# Patient Record
Sex: Female | Born: 1975 | Race: Black or African American | Hispanic: No | Marital: Single | Smoking: Current every day smoker
Health system: Southern US, Community
[De-identification: ages and names within clinical notes are randomized; demographics above are authoritative.]

## PROBLEM LIST (undated history)

## (undated) DIAGNOSIS — F32A Depression, unspecified: Secondary | ICD-10-CM

## (undated) DIAGNOSIS — R569 Unspecified convulsions: Secondary | ICD-10-CM

## (undated) DIAGNOSIS — R519 Headache, unspecified: Secondary | ICD-10-CM

## (undated) DIAGNOSIS — J45909 Unspecified asthma, uncomplicated: Secondary | ICD-10-CM

## (undated) DIAGNOSIS — G459 Transient cerebral ischemic attack, unspecified: Secondary | ICD-10-CM

## (undated) HISTORY — PX: DILATION AND CURETTAGE OF UTERUS: SHX78

## (undated) HISTORY — PX: TUBAL LIGATION: SHX77

## (undated) HISTORY — PX: TONSILLECTOMY: SUR1361

## (undated) HISTORY — PX: BREAST SURGERY: SHX581

---

## 2004-03-07 ENCOUNTER — Emergency Department: Payer: Self-pay | Admitting: Emergency Medicine

## 2005-01-04 ENCOUNTER — Emergency Department: Payer: Self-pay | Admitting: Emergency Medicine

## 2005-03-21 ENCOUNTER — Emergency Department: Payer: Self-pay | Admitting: Emergency Medicine

## 2005-10-02 ENCOUNTER — Emergency Department: Payer: Self-pay | Admitting: Emergency Medicine

## 2006-03-11 ENCOUNTER — Emergency Department: Payer: Self-pay | Admitting: Emergency Medicine

## 2006-09-16 ENCOUNTER — Emergency Department: Payer: Self-pay | Admitting: Emergency Medicine

## 2006-11-12 ENCOUNTER — Encounter: Admission: RE | Admit: 2006-11-12 | Discharge: 2006-11-12 | Payer: Self-pay | Admitting: Family Medicine

## 2009-10-29 ENCOUNTER — Emergency Department: Payer: Self-pay | Admitting: Emergency Medicine

## 2011-12-10 ENCOUNTER — Emergency Department: Payer: Self-pay | Admitting: Emergency Medicine

## 2011-12-10 LAB — URINALYSIS, COMPLETE
Blood: NEGATIVE
Glucose,UR: NEGATIVE mg/dL (ref 0–75)
Leukocyte Esterase: NEGATIVE
Nitrite: NEGATIVE
Ph: 6 (ref 4.5–8.0)
RBC,UR: 5 /HPF (ref 0–5)

## 2011-12-10 LAB — WET PREP, GENITAL

## 2012-07-25 ENCOUNTER — Emergency Department: Payer: Self-pay | Admitting: Emergency Medicine

## 2012-07-25 LAB — COMPREHENSIVE METABOLIC PANEL
Albumin: 4.1 g/dL (ref 3.4–5.0)
Calcium, Total: 9 mg/dL (ref 8.5–10.1)
Chloride: 103 mmol/L (ref 98–107)
Co2: 25 mmol/L (ref 21–32)
Creatinine: 1.01 mg/dL (ref 0.60–1.30)
EGFR (African American): >60
EGFR (Non-African Amer.): >60
Osmolality: 273 (ref 275–301)
Potassium: 4 mmol/L (ref 3.5–5.1)
SGPT (ALT): 20 U/L (ref 12–78)
Sodium: 136 mmol/L (ref 136–145)

## 2012-07-25 LAB — URINALYSIS, COMPLETE
Bilirubin,UR: NEGATIVE
Blood: NEGATIVE
Ketone: NEGATIVE
Leukocyte Esterase: NEGATIVE
Ph: 6 (ref 4.5–8.0)
Squamous Epithelial: <1

## 2012-07-25 LAB — CBC
HGB: 14.9 g/dL (ref 12.0–16.0)
MCH: 30.4 pg (ref 26.0–34.0)
MCHC: 33.9 g/dL (ref 32.0–36.0)
MCV: 90 fL (ref 80–100)
RBC: 4.9 10*6/uL (ref 3.80–5.20)
RDW: 13.7 % (ref 11.5–14.5)
WBC: 9.8 10*3/uL (ref 3.6–11.0)

## 2014-07-05 ENCOUNTER — Emergency Department: Payer: Self-pay | Admitting: Emergency Medicine

## 2015-03-07 ENCOUNTER — Emergency Department
Admission: EM | Admit: 2015-03-07 | Discharge: 2015-03-07 | Disposition: A | Discharge status: Home / Self Care | Payer: Medicare Other | Attending: Emergency Medicine | Admitting: Emergency Medicine

## 2015-03-07 ENCOUNTER — Emergency Department: Payer: Medicare Other

## 2015-03-07 DIAGNOSIS — X58XXXA Exposure to other specified factors, initial encounter: Secondary | ICD-10-CM | POA: Insufficient documentation

## 2015-03-07 DIAGNOSIS — Z3202 Encounter for pregnancy test, result negative: Secondary | ICD-10-CM | POA: Insufficient documentation

## 2015-03-07 DIAGNOSIS — Y9289 Other specified places as the place of occurrence of the external cause: Secondary | ICD-10-CM | POA: Insufficient documentation

## 2015-03-07 DIAGNOSIS — Y998 Other external cause status: Secondary | ICD-10-CM | POA: Diagnosis not present

## 2015-03-07 DIAGNOSIS — S63255A Unspecified dislocation of left ring finger, initial encounter: Secondary | ICD-10-CM | POA: Diagnosis not present

## 2015-03-07 DIAGNOSIS — F1310 Sedative, hypnotic or anxiolytic abuse, uncomplicated: Secondary | ICD-10-CM | POA: Diagnosis not present

## 2015-03-07 DIAGNOSIS — F1410 Cocaine abuse, uncomplicated: Secondary | ICD-10-CM | POA: Insufficient documentation

## 2015-03-07 DIAGNOSIS — Z720 Tobacco use: Secondary | ICD-10-CM | POA: Diagnosis not present

## 2015-03-07 DIAGNOSIS — G40909 Epilepsy, unspecified, not intractable, without status epilepticus: Secondary | ICD-10-CM | POA: Insufficient documentation

## 2015-03-07 DIAGNOSIS — Y9389 Activity, other specified: Secondary | ICD-10-CM | POA: Diagnosis not present

## 2015-03-07 DIAGNOSIS — R569 Unspecified convulsions: Secondary | ICD-10-CM | POA: Diagnosis present

## 2015-03-07 DIAGNOSIS — S63259A Unspecified dislocation of unspecified finger, initial encounter: Secondary | ICD-10-CM

## 2015-03-07 LAB — URINALYSIS COMPLETE WITH MICROSCOPIC (ARMC ONLY)
BILIRUBIN URINE: NEGATIVE
GLUCOSE, UA: NEGATIVE mg/dL
Leukocytes, UA: NEGATIVE
Nitrite: NEGATIVE
PH: 5 (ref 5.0–8.0)
Protein, ur: NEGATIVE mg/dL
RBC / HPF: NONE SEEN RBC/hpf (ref 0–5)
Specific Gravity, Urine: 1.014 (ref 1.005–1.030)

## 2015-03-07 LAB — COMPREHENSIVE METABOLIC PANEL
ALBUMIN: 5 g/dL (ref 3.5–5.0)
ALK PHOS: 51 U/L (ref 38–126)
ALT: 18 U/L (ref 14–54)
AST: 22 U/L (ref 15–41)
Anion gap: 12 (ref 5–15)
BILIRUBIN TOTAL: 0.8 mg/dL (ref 0.3–1.2)
BUN: 12 mg/dL (ref 6–20)
CALCIUM: 9.9 mg/dL (ref 8.9–10.3)
CO2: 21 mmol/L — AB (ref 22–32)
CREATININE: 0.83 mg/dL (ref 0.44–1.00)
Chloride: 107 mmol/L (ref 101–111)
GFR calc Af Amer: >60 mL/min (ref >60)
GFR calc non Af Amer: >60 mL/min (ref >60)
GLUCOSE: 86 mg/dL (ref 65–99)
Potassium: 4.3 mmol/L (ref 3.5–5.1)
SODIUM: 140 mmol/L (ref 135–145)
TOTAL PROTEIN: 8.6 g/dL — AB (ref 6.5–8.1)

## 2015-03-07 LAB — CBC
HEMATOCRIT: 44.5 % (ref 35.0–47.0)
HEMOGLOBIN: 14.7 g/dL (ref 12.0–16.0)
MCH: 29.4 pg (ref 26.0–34.0)
MCHC: 33 g/dL (ref 32.0–36.0)
MCV: 89.1 fL (ref 80.0–100.0)
Platelets: 204 10*3/uL (ref 150–440)
RBC: 4.99 MIL/uL (ref 3.80–5.20)
RDW: 13.8 % (ref 11.5–14.5)
WBC: 12.6 10*3/uL — AB (ref 3.6–11.0)

## 2015-03-07 LAB — URINE DRUG SCREEN, QUALITATIVE (ARMC ONLY)
AMPHETAMINES, UR SCREEN: NOT DETECTED
BARBITURATES, UR SCREEN: NOT DETECTED
BENZODIAZEPINE, UR SCRN: NOT DETECTED
Cannabinoid 50 Ng, Ur ~~LOC~~: NOT DETECTED
Cocaine Metabolite,Ur ~~LOC~~: POSITIVE — AB
MDMA (Ecstasy)Ur Screen: NOT DETECTED
METHADONE SCREEN, URINE: NOT DETECTED
OPIATE, UR SCREEN: NOT DETECTED
Phencyclidine (PCP) Ur S: NOT DETECTED
TRICYCLIC, UR SCREEN: POSITIVE — AB

## 2015-03-07 LAB — POCT PREGNANCY, URINE: Preg Test, Ur: NEGATIVE

## 2015-03-07 MED ORDER — BUPIVACAINE HCL (PF) 0.5 % IJ SOLN
INTRAMUSCULAR | Status: AC
  Filled 2015-03-07: qty 30

## 2015-03-07 MED ORDER — PHENYTOIN 50 MG PO CHEW
400.0000 mg | CHEWABLE_TABLET | ORAL | Status: AC
  Administered 2015-03-07: 400 mg via ORAL
  Filled 2015-03-07: qty 8

## 2015-03-07 MED ORDER — PHENYTOIN SODIUM EXTENDED 100 MG PO CAPS: 300.0000 mg | ORAL_CAPSULE | Freq: Every day | ORAL | Status: AC

## 2015-03-07 MED ORDER — LEVETIRACETAM 500 MG PO TABS: 1000.0000 mg | ORAL_TABLET | Freq: Once | ORAL | Status: DC

## 2015-03-07 NOTE — ED Notes (Signed)
Pharm is tubing down Dilantin, not found in pyxis

## 2015-03-07 NOTE — ED Notes (Signed)
RN made pt aware of needed urine specimen, pt reports she urinated before entering the room. Will wait for specimen

## 2015-03-07 NOTE — Discharge Instructions (Signed)
Finger Dislocation Finger dislocation is the displacement of bones in your finger at the joints. Most commonly, finger dislocation occurs at the proximal interphalangeal joint (the joint closest to your knuckle). Very strong, fibrous tissues (ligaments) and joint capsules connect the three bones of your fingers.  CAUSES Dislocation is caused by a forceful impact. This impact moves these bones off the joint and often tears your ligaments.  SYMPTOMS Symptoms of finger dislocation include:  Deformity of your finger.  Pain, with loss of movement. DIAGNOSIS  Finger dislocation is diagnosed with a physical exam. Often, X-ray exams are done to see if you have associated injuries, such as bone fractures. TREATMENT  Finger dislocations are treated by putting your bones back into position (reduction) either by manually moving the bones back into place or through surgery. Your finger is then kept in a fixed position (immobilized) with the use of a dressing or splint for a brief period. When your ligament has to be surgically repaired, it needs to be kept in a fixed position with a dressing or splint for 1 to 2 weeks. Because joint stiffness is a long-term complication of finger dislocation, hand exercises or physical therapy to increase the range of motion and to regain strength is usually started as soon as the ligament is healed. Exercises and therapy generally last no more than 3 months. HOME CARE INSTRUCTIONS The following measures can help to reduce pain and speed up the healing process:  Rest your injured joint. Do not move until instructed otherwise by your caregiver. Avoid activities similar to the one that caused your injury.  Apply ice to your injured joint for the first day or 2 after your reduction or as directed by your caregiver. Applying ice helps to reduce inflammation and pain.  Put ice in a plastic bag.  Place a towel between your skin and the bag.  Leave the ice on for 15-20 minutes  at a time, every 2 hours while you are awake.  Elevate your hand above your heart as directed by your caregiver to reduce swelling.  Take over-the-counter or prescription medicine for pain as your caregiver instructs you. SEEK IMMEDIATE MEDICAL CARE IF:  Your dressing or splint becomes damaged.  Your pain becomes worse rather than better.  You lose feeling in your finger, or it becomes cold and white. MAKE SURE YOU:  Understand these instructions.  Will watch your condition.  Will get help right away if you are not doing well or get worse.   This information is not intended to replace advice given to you by your health care provider. Make sure you discuss any questions you have with your health care provider.   Document Released: 04/11/2000 Document Revised: 05/05/2014 Document Reviewed: 09/08/2014 Elsevier Interactive Patient Education [employer]  Epilepsy Epilepsy is a disorder in which a person has repeated seizures over time. A seizure is a release of abnormal electrical activity in the brain. Seizures can cause a change in attention, behavior, or the ability to remain awake and alert (altered mental status). Seizures often involve uncontrollable shaking (convulsions).  Most people with epilepsy lead normal lives. However, people with epilepsy are at an increased risk of falls, accidents, and injuries. Therefore, it is important to begin treatment right away. CAUSES  Epilepsy has many possible causes. Anything that disturbs the normal pattern of brain cell activity can lead to seizures. This may include:   Head injury.  Birth trauma.  High fever as a child.  Stroke.  Bleeding  into or around the brain.  Certain drugs.  Prolonged low oxygen, such as what occurs after CPR efforts.  Abnormal brain development.  Certain illnesses, such as meningitis, encephalitis (brain infection), malaria, and other infections.  An imbalance of nerve signaling chemicals  (neurotransmitters).  SIGNS AND SYMPTOMS  The symptoms of a seizure can vary greatly from one person to another. Right before a seizure, you may have a warning (aura) that a seizure is about to occur. An aura may include the following symptoms:  Fear or anxiety.  Nausea.  Feeling like the room is spinning (vertigo).  Vision changes, such as seeing flashing lights or spots. Common symptoms during a seizure include:  Abnormal sensations, such as an abnormal smell or a bitter taste in the mouth.   Sudden, general body stiffness.   Convulsions that involve rhythmic jerking of the face, arm, or leg on one or both sides.   Sudden change in consciousness.   Appearing to be awake but not responding.   Appearing to be asleep but cannot be awakened.   Grimacing, chewing, lip smacking, drooling, tongue biting, or loss of bowel or bladder control. After a seizure, you may feel sleepy for a while. DIAGNOSIS  Your health care provider will ask about your symptoms and take a medical history. Descriptions from any witnesses to your seizures will be very helpful in the diagnosis. A physical exam, including a detailed neurological exam, is necessary. Various tests may be done, such as:   An electroencephalogram (EEG). This is a painless test of your brain waves. In this test, a diagram is created of your brain waves. These diagrams can be interpreted by a specialist.  An MRI of the brain.   A CT scan of the brain.   A spinal tap (lumbar puncture, LP).  Blood tests to check for signs of infection or abnormal blood chemistry. TREATMENT  There is no cure for epilepsy, but it is generally treatable. Once epilepsy is diagnosed, it is important to begin treatment as soon as possible. For most people with epilepsy, seizures can be controlled with medicines. The following may also be used:  A pacemaker for the brain (vagus nerve stimulator) can be used for people with seizures that are not  well controlled by medicine.  Surgery on the brain. For some people, epilepsy eventually goes away. HOME CARE INSTRUCTIONS   Follow your health care provider's recommendations on driving and safety in normal activities.  Get enough rest. Lack of sleep can cause seizures.  Only take over-the-counter or prescription medicines as directed by your health care provider. Take any prescribed medicine exactly as directed.  Avoid any known triggers of your seizures.  Keep a seizure diary. Record what you recall about any seizure, especially any possible trigger.   Make sure the people you live and work with know that you are prone to seizures. They should receive instructions on how to help you. In general, a witness to a seizure should:   Cushion your head and body.   Turn you on your side.   Avoid unnecessarily restraining you.   Not place anything inside your mouth.   Call for emergency medical help if there is any question about what has occurred.   Follow up with your health care provider as directed. You may need regular blood tests to monitor the levels of your medicine.  SEEK MEDICAL CARE IF:   You develop signs of infection or other illness. This might increase the risk of a  seizure.   You seem to be having more frequent seizures.   Your seizure pattern is changing.  SEEK IMMEDIATE MEDICAL CARE IF:   You have a seizure that does not stop after a few moments.   You have a seizure that causes any difficulty in breathing.   You have a seizure that results in a very severe headache.   You have a seizure that leaves you with the inability to speak or use a part of your body.    This information is not intended to replace advice given to you by your health care provider. Make sure you discuss any questions you have with your health care provider.   Document Released: 04/14/2005 Document Revised: 02/02/2013 Document Reviewed: 11/24/2012 Elsevier Interactive  Patient Education [employer]

## 2015-03-07 NOTE — ED Notes (Addendum)
MD Quale at bedside, doing dig block at this time. Bupivacaine pulled and given to MD Quale at this time for administration during dig block.

## 2015-03-07 NOTE — ED Notes (Signed)
MD at bedside. 

## 2015-03-07 NOTE — ED Notes (Signed)
Pt presents with deformity and limited movement to left ring finger. States she had a seizure at home and woke up with finger pain. Complains of pain finger only at this time. Will continue to monitor.

## 2015-03-07 NOTE — ED Provider Notes (Signed)
[facility] Emergency Department Provider Note REMINDER - THIS NOTE IS NOT A FINAL MEDICAL RECORD UNTIL IT IS SIGNED. UNTIL THEN, THE CONTENT BELOW MAY REFLECT INFORMATION FROM A DOCUMENTATION TEMPLATE, NOT THE ACTUAL PATIENT VISIT. ____________________________________________  Time seen: Approximately 3:41 PM  I have reviewed the triage vital signs and the nursing notes.   HISTORY  Chief Complaint Seizures and Hand Pain    HPI Alisha Taylor is a 39 y.o. female reports that she had a seizure today. This was witnessed where she was watching television, she suddenly became unresponsive and had a generalized shaking episodes last about 5 minutes. Patient does not recall the event but it was slight headache afterwards and now feels that is gone. She is denying any concerns, headache, neck pain, fall or injury except for a possible "broken" left ring finger. She is retaken her wedding ring and moved it to the other hand, not wearing any other rings.  She denies any facial droop, difficulty speaking. She does tell me that she has seizures which she's been having and were witnessed by friends over the last several years occurring a proximally once every 4 months, however she has never been evaluated by Dr. for them.  Describes and achy pain over the left second finger. She does describe a slight feeling of numbness at the tip of the left finger. Moderate intensity.   History reviewed. No pertinent past medical history.  There are no active problems to display for this patient.   History reviewed. No pertinent past surgical history.  Current Outpatient Rx  Name  Route  Sig  Dispense  Refill  . diclofenac (VOLTAREN) 50 MG EC tablet   Oral   Take 50 mg by mouth 4 (four) times daily as needed for mild pain or moderate pain.         Marland Kitchen gabapentin (NEURONTIN) 300 MG capsule   Oral   Take 900 mg by mouth 3 (three) times daily as needed (chronic back pain).          Marland Kitchen metoCLOPramide (REGLAN) 10 MG tablet   Oral   Take 10 mg by mouth every 6 (six) hours as needed for nausea or vomiting.         . naproxen (NAPROSYN) 500 MG tablet   Oral   Take 500 mg by mouth 2 (two) times daily as needed for mild pain or moderate pain.         . phenytoin (DILANTIN) 100 MG ER capsule   Oral   Take 3 capsules (300 mg total) by mouth at bedtime.   60 capsule   0     Allergies Review of patient's allergies indicates no known allergies.  History reviewed. No pertinent family history.  Social History Social History  Substance Use Topics  . Smoking status: Current Every Day Smoker  . Smokeless tobacco: None  . Alcohol Use: Yes    Review of Systems Constitutional: No fever/chills Eyes: No visual changes. ENT: No sore throat. Cardiovascular: Denies chest pain. Respiratory: Denies shortness of breath. Gastrointestinal: No abdominal pain.  No nausea, no vomiting.  No diarrhea.  No constipation. Genitourinary: Negative for dysuria. Musculoskeletal: Negative for back pain. Skin: Negative for rash. Neurological: Negative for headaches, focal weakness or numbness.  10-point ROS otherwise negative.  ____________________________________________   PHYSICAL EXAM:  VITAL SIGNS: ED Triage Vitals  Enc Vitals Group     BP 03/07/15 1402 137/96 mmHg     Pulse Rate 03/07/15 1402 117  Resp 03/07/15 1402 16     Temp 03/07/15 1402 98.4 F (36.9 C)     Temp Source 03/07/15 1402 Oral     SpO2 03/07/15 1402 100 %     Weight 03/07/15 1402 185 lb (83.915 kg)     Height 03/07/15 1402 5\' 7"  (1.702 m)     Head Cir --      Peak Flow --      Pain Score 03/07/15 1404 4     Pain Loc --      Pain Edu? --      Excl. in GC? --    Constitutional: Alert and oriented. Well appearing and in no acute distress. Eyes: Conjunctivae are normal. PERRL. EOMI. Head: Atraumatic. Nose: No congestion/rhinnorhea. Mouth/Throat: Mucous membranes are moist.  Oropharynx  non-erythematous. Neck: No stridor.  No cervical spine tenderness. Cardiovascular: Normal rate, regular rhythm. Grossly normal heart sounds.  Good peripheral circulation. Respiratory: Normal respiratory effort.  No retractions. Lungs CTAB. Gastrointestinal: Soft and nontender. No distention. No abdominal bruits. No CVA tenderness. Musculoskeletal:  Right upper extremity, hand, elbow and shoulder atraumatic Left upper extremity elbow, shoulder, upper arm and forearm atraumatic. The left hand is atraumatic with full range of motion of all digits and normal median ulnar and radial nerve exam with exception to the fourth digit where there is obvious deformity at the PIP joint without evidence of open fracture bleeding. Patient is unable to move the left fourth digit, appears to be either fractured or dislocated based on bedside exam. She does have normal capillary refill in the left finger which is less than 2 seconds, she does note some slight decrease in light touch over the distal tuft of the left finger.  No lower extremity tenderness nor edema.  No joint effusions. Neurologic:  Normal speech and language. Normal cranial nerve exam. No facial droop. Clear and normal speech. 5 out of 5 strength all extremities. No gross focal neurologic deficits are appreciated. No gait instability. Skin:  Skin is warm, dry and intact. No rash noted. Psychiatric: Mood and affect are normal. Speech and behavior are normal.  ____________________________________________   LABS (all labs ordered are listed, but only abnormal results are displayed)  Labs Reviewed  URINALYSIS COMPLETEWITH MICROSCOPIC ([facility] ONLY) - Abnormal; Notable for the following:    Color, Urine YELLOW (*)    APPearance CLEAR (*)    Ketones, ur 1+ (*)    Hgb urine dipstick 1+ (*)    Bacteria, UA RARE (*)    Squamous Epithelial / LPF 0-5 (*)    All other components within normal limits  URINE DRUG SCREEN, QUALITATIVE ([facility] ONLY) - Abnormal;  Notable for the following:    Tricyclic, Ur Screen POSITIVE (*)    Cocaine Metabolite,Ur Teresita POSITIVE (*)    All other components within normal limits  CBC - Abnormal; Notable for the following:    WBC 12.6 (*)    All other components within normal limits  COMPREHENSIVE METABOLIC PANEL - Abnormal; Notable for the following:    CO2 21 (*)    Total Protein 8.6 (*)    All other components within normal limits  POC URINE PREG, ED  POCT PREGNANCY, URINE   ____________________________________________  EKG  ED ECG REPORT I, Tiarah Shisler, the attending physician, personally viewed and interpreted this ECG.  Date: 03/07/2015 EKG Time: 1646 Rate: 90 Rhythm: normal sinus rhythm QRS Axis: normal Intervals: normal ST/T Wave abnormalities: normal Conduction Disutrbances: none Narrative Interpretation: unremarkable  ____________________________________________  RADIOLOGY   Final result by Rad Results In Interface (03/07/15 17:02:57)   Narrative:   CLINICAL DATA: Recent dislocation fourth PIP joint ; post rid  EXAM: LEFT FOURTH FINGER 2+V  COMPARISON: Study obtained earlier in the day  FINDINGS: Frontal, oblique, and lateral views obtained. There has been successful reduction of recent dislocation of the fourth PIP joint. There is soft tissue swelling in the fourth PIP joint region. On the lateral view, there is a subtle area of calcification volar to the distal aspect of the fourth proximal phalanx, likely a small avulsion. No other evidence of fracture.  IMPRESSION: Successful reduction of fourth PIP joint dislocation. Probable small avulsion along the volar distal aspect of the fourth proximal phalanx. Soft tissue swelling fourth PIP joint, primarily dorsally.   Electronically Signed By: Bretta Bang III M.D. On: 03/07/2015 17:02          CT Head Wo Contrast (Final result) Result time: 03/07/15 16:32:13   Final result by Rad Results In Interface  (03/07/15 16:32:13)   Narrative:   CLINICAL DATA: Recent seizure  EXAM: CT HEAD WITHOUT CONTRAST  TECHNIQUE: Contiguous axial images were obtained from the base of the skull through the vertex without intravenous contrast.  COMPARISON: October 29, 2009  FINDINGS: The ventricles are normal in size and configuration. There is no intracranial mass hemorrhage, extra-axial fluid collection, or midline shift. The gray-white compartments are normal. No acute infarct evident. The bony calvarium appears intact. The mastoid air cells are clear.  IMPRESSION: Study within normal limits.   Electronically Signed By: Bretta Bang III M.D. On: 03/07/2015 16:32          DG Hand Complete Left (Final result) Result time: 03/07/15 16:11:07   Final result by Rad Results In Interface (03/07/15 16:11:07)   Narrative:   CLINICAL DATA: Seizure. Finger pain  EXAM: LEFT HAND - COMPLETE 3+ VIEW  COMPARISON: None.  FINDINGS: Dorsal dislocation of the fourth PIP joint. No fracture identified. No significant arthropathy.  Fracture at the base of the ulnar styloid which is well corticated and likely chronic. Correlate with any pain in this area.  IMPRESSION: Dislocation of the fourth PIP joint without fracture.  Fracture base of the ulnar styloid which appears chronic.     ____________________________________________   PROCEDURES  Procedure(s) performed: None  Critical Care performed: No  NERVE BLOCK Performed by: Sharyn Creamer Consent: Verbal consent obtained. Required items: required blood products, implants, devices, and special equipment available Time out: Immediately prior to procedure a "time out" was called to verify the correct patient, procedure, equipment, support staff and site/side marked as required.  Indication: Pain and deformity of the left ring finger  Nerve block body site: Left ring finger digital block   Preparation: Patient was prepped  and draped in the usual sterile fashion. Needle gauge: 24 G Location technique: anatomical landmarks  Local anesthetic: 0.5% bupivacaine Anesthetic total: 2 ml  Outcome: pain improved Patient tolerance: Patient tolerated the procedure well with no immediate complications.  Reduction of dislocation Date/Time: 7:53 PM Performed by: Sharyn Creamer Authorized by: Sharyn Creamer Consent: Verbal consent obtained. Risks and benefits: risks, benefits and alternatives were discussed Consent given by: patient Required items: Time out: Immediately prior to procedure a "time out" was called to verify the correct patient, procedure, equipment, support staff and site/side marked as required.    Vitals: Vital signs were monitored during sedation. Patient tolerance: Patient tolerated the procedure well with no immediate complications. Joint: Left fourth digit PIP hand  Reduction technique: Patient joint reduced with in-line tension and distraction, after approximately 20 seconds of applying pressure the patient's digit was reduced back to normal anatomic position. X-ray ordered for evaluation. No complications. Good capillary refill. Patient is unable to feel the end of the finger due to bupivacaine, no evidence of concerns or palpitations.       ____________________________________________   INITIAL IMPRESSION / ASSESSMENT AND PLAN / ED COURSE  Pertinent labs & imaging results that were available during my care of the patient were reviewed by me and considered in my medical decision making (see chart for details).  1) Patient presents for concerns or recurrent seizures. She describes what sounds to be recurrent generalized seizures. She has no evidence of neurologic compromise, she is awake alert in no distress. As she has never had previous physician evaluation we will obtain labs and CT imaging. I'll discuss with neurology, the patient tells me she thinks she may need to be on a seizure medicine  allergies had several episodes over the last several years, each occurring about every 3-4 months. She does endorse being under pressure and not having slept well last night, likely causing worsening of her seizure. She denies any illicit drug use and takes no medications. She denies pregnancy, she has a history of a previous tubal ligation.  2) injury to the left ring finger. Digital block applied with good effect and pain control. Splinted with normal capillary refill after. Patient tolerated well. Normal postreduction film. Patient does report she had a previous fracture of the left wrist longer.  Discussed careful follow-up with neurology clinic, I gave her a prescription for Dilantin after discussion with neurology Dr. Loretha Brasil who advised loading in the ER with Dilantin and then discharging with 300 mg daily at bedtime and close outpatient follow-up. The patient does not drive. Careful return precautions discussed there including that she should not drive or place herself in a dangerous place that she could become injured if she had another seizure. She will follow-up closely as an outpatient. Return precautions advised.  ----------------------------------------- 7:52 PM on 03/07/2015 -----------------------------------------  After discussing her abuse screen, the patient noted that she was stressed out a couple days ago and used cocaine once for the first time. She tells me this was a poor decision and she will not do this again. She also knowledge is discharged instructions and follow-up plan. ____________________________________________   FINAL CLINICAL IMPRESSION(S) / ED DIAGNOSES  Final diagnoses:  Seizure disorder (HCC)  Dislocated finger, initial encounter  Cocaine abuse      Sharyn Creamer, MD 03/07/15 1956

## 2015-03-07 NOTE — ED Notes (Addendum)
Patient thinks she had seizure and when she woke up she had pain to left hand.  Seizure was not witnessed. Patient denies incontinent episode post seizure.

## 2015-03-07 NOTE — ED Notes (Signed)
Pt being transported to x-ray at this time.  

## 2015-04-03 ENCOUNTER — Ambulatory Visit: Payer: Medicare Other | Admitting: Pain Medicine

## 2016-05-08 ENCOUNTER — Other Ambulatory Visit: Payer: Self-pay | Admitting: Obstetrics and Gynecology

## 2016-05-08 DIAGNOSIS — I82712 Chronic embolism and thrombosis of superficial veins of left upper extremity: Secondary | ICD-10-CM

## 2016-05-08 DIAGNOSIS — L02422 Furuncle of left axilla: Secondary | ICD-10-CM

## 2016-05-19 ENCOUNTER — Ambulatory Visit: Admission: RE | Admit: 2016-05-19 | Payer: Medicare Other | Source: Ambulatory Visit

## 2019-02-04 ENCOUNTER — Other Ambulatory Visit (HOSPITAL_COMMUNITY): Payer: Self-pay | Admitting: General Surgery

## 2019-02-04 ENCOUNTER — Other Ambulatory Visit: Payer: Self-pay | Admitting: General Surgery

## 2019-02-04 DIAGNOSIS — K439 Ventral hernia without obstruction or gangrene: Secondary | ICD-10-CM

## 2019-02-11 ENCOUNTER — Encounter: Payer: Self-pay | Admitting: Radiology

## 2019-02-11 ENCOUNTER — Other Ambulatory Visit: Payer: Self-pay

## 2019-02-11 ENCOUNTER — Ambulatory Visit
Admission: RE | Admit: 2019-02-11 | Discharge: 2019-02-11 | Disposition: A | Discharge status: Home / Self Care | Payer: Medicare HMO | Source: Ambulatory Visit | Attending: General Surgery | Admitting: General Surgery

## 2019-02-11 DIAGNOSIS — K439 Ventral hernia without obstruction or gangrene: Secondary | ICD-10-CM | POA: Diagnosis present

## 2019-02-11 HISTORY — DX: Unspecified asthma, uncomplicated: J45.909

## 2019-02-11 MED ORDER — IOHEXOL 300 MG/ML  SOLN
100.0000 mL | Freq: Once | INTRAMUSCULAR | Status: AC | PRN
  Administered 2019-02-11: 100 mL via INTRAVENOUS

## 2019-02-14 ENCOUNTER — Ambulatory Visit: Payer: Self-pay | Admitting: General Surgery

## 2019-02-14 NOTE — H&P (Signed)
PATIENT PROFILE: Modell Fendrick is a 43 y.o. female who presents to the Clinic for consultation at the request of Dr. Clide Deutscher for evaluation of ventral hernia.  PCP:  Tomasa Hose Achirimofor, MD  HISTORY OF PRESENT ILLNESS: Ms. Ramaswamy reports having hernia in the epigastric area since many years ago.  She reports that in the last 2 months he has been causing her pain.  The pain does not radiate to other part of the body.  The pain is aggravated by applying pressure.  There is no alleviating factor.  Patient reports some nausea with the pain but no vomiting.  Reports having regular bowel movement.  Report tolerating diet.  She denies any abdominal surgical history other than tubal ligation many years ago.   PROBLEM LIST: Ventral hernia Smoker  GENERAL REVIEW OF SYSTEMS:   General ROS: negative for - chills, fatigue, fever, weight gain or weight loss Allergy and Immunology ROS: negative for - hives  Hematological and Lymphatic ROS: negative for - bleeding problems or bruising, negative for palpable nodes Endocrine ROS: negative for - heat or cold intolerance, hair changes Respiratory ROS: negative for - cough, shortness of breath or wheezing Cardiovascular ROS: no chest pain or palpitations GI ROS: negative for nausea, vomiting, abdominal pain, diarrhea, constipation.  Positive for abdominal pain.  Positive for change in appetite. Musculoskeletal ROS: negative for - joint swelling or muscle pain Neurological ROS: negative for - confusion, syncope.  Positive for headache Dermatological ROS: negative for pruritus and rash Psychiatric: negative for anxiety, depression, difficulty sleeping and memory loss  MEDICATIONS: Current Medications        Current Outpatient Medications  Medication Sig Dispense Refill  . busPIRone (BUSPAR) 10 MG tablet Take 1 tablet by mouth 2 (two) times daily    . gabapentin (NEURONTIN) 600 MG tablet Take 2 tablets by mouth 3 (three) times daily    .  lamoTRIgine (LAMICTAL) 100 MG tablet Take 2 tablets by mouth once daily    . metroNIDAZOLE (METROGEL) 0.75 % vaginal gel every 14 (fourteen) days    . tiZANidine (ZANAFLEX) 4 MG tablet Take 1 tablet by mouth every 6 (six) hours as needed    . VENTOLIN HFA 90 mcg/actuation inhaler every 6 (six) hours as needed     No current facility-administered medications for this visit.       ALLERGIES: Patient has no known allergies.  PAST MEDICAL HISTORY:     Past Medical History:  Diagnosis Date  . Anxiety   . Back pain   . History of stroke   . Seizures (CMS-HCC)    not often    PAST SURGICAL HISTORY:      Past Surgical History:  Procedure Laterality Date  . ESSURE TUBAL LIGATION  11/03/2000     FAMILY HISTORY:      Family History  Problem Relation Age of Onset  . Diabetes Mother   . Diabetes Father      SOCIAL HISTORY: Social History          Socioeconomic History  . Marital status: Life Partner    Spouse name: Not on file  . Number of children: Not on file  . Years of education: Not on file  . Highest education level: Not on file  Occupational History  . Not on file  Social Needs  . Financial resource strain: Not on file  . Food insecurity    Worry: Not on file    Inability: Not on file  . Transportation needs  Medical: Not on file    Non-medical: Not on file  Tobacco Use  . Smoking status: Current Some Day Smoker  . Smokeless tobacco: Never Used  Substance and Sexual Activity  . Alcohol use: Yes    Alcohol/week: 8.0 standard drinks    Types: 8 Cans of beer per week  . Drug use: Never  . Sexual activity: Not on file  Other Topics Concern  . Not on file  Social History Narrative  . Not on file      PHYSICAL EXAM:    Vitals:   02/04/19 0945  BP: 110/70  Pulse: 77   Body mass index is 28.73 kg/m. Weight: 80.7 kg (178 lb)   GENERAL: Alert, active, oriented x3  HEENT: Pupils equal reactive to  light. Extraocular movements are intact. Sclera clear. Palpebral conjunctiva normal red color.Pharynx clear.  NECK: Supple with no palpable mass and no adenopathy.  LUNGS: Sound clear with no rales rhonchi or wheezes.  HEART: Regular rhythm S1 and S2 without murmur.  ABDOMEN: Soft and depressible, nontender with no palpable mass, no hepatomegaly.  Small lump on the epigastric area with diastases unable to palpate the hernia defect.  Small umbilical hernia with palpable small defect.  EXTREMITIES: Well-developed well-nourished symmetrical with no dependent edema.  NEUROLOGICAL: Awake alert oriented, facial expression symmetrical, moving all extremities.  REVIEW OF DATA: I have reviewed the following data today: No visits with results within 3 Month(s) from this visit.  Latest known visit with results is:  No results found for any previous visit.     ASSESSMENT: Ms. Furno is a 43 y.o. female presenting for consultation for ventral hernia.  Patient with a lump on the epigastric area.  This lump is in continuation with the longer lump that goes to the umbilical area.  There is a question of a possible ventral hernia in a diastases recti.  Patient also has a small umbilical hernia.  I discussed with her diagnosis of epigastric hernia.  I also discussed with her the surgical management.  I discussed with her the alternative of minimally invasive ventral hernia repair with mesh.  I discussed with her the risk of pain, injury to bowel, bleeding, infection and chronic pain.    CT scan shows a small epigastric hernia with incarcerated fat.  There was also small umbilical hernia.  I did recommend to the patient to quit smoking.  I had a long discussion about the increased risk of infection, recurrence, pulmonary complications due to anesthesia.  Ventral hernia without obstruction or gangrene [K43.9]  PLAN: 1.    Robotic assisted laparoscopic epigastric hernia repair.  Open  umbilical hernia repair  Patient verbalized understanding, all questions were answered, and were agreeable with the plan outlined above.   This was a 60-minute encounter most of the time counseling the patient and coordinating plan of care.  Carolan Shiver, MD  Electronically signed by Carolan Shiver, MD

## 2019-02-18 ENCOUNTER — Other Ambulatory Visit: Admission: RE | Admit: 2019-02-18 | Payer: Medicare HMO | Source: Ambulatory Visit

## 2019-02-18 ENCOUNTER — Other Ambulatory Visit
Admission: RE | Admit: 2019-02-18 | Discharge: 2019-02-18 | Disposition: A | Discharge status: Home / Self Care | Payer: Medicare HMO | Source: Ambulatory Visit | Attending: General Surgery | Admitting: General Surgery

## 2019-02-18 ENCOUNTER — Other Ambulatory Visit: Payer: Self-pay

## 2019-02-18 DIAGNOSIS — Z20828 Contact with and (suspected) exposure to other viral communicable diseases: Secondary | ICD-10-CM | POA: Insufficient documentation

## 2019-02-18 DIAGNOSIS — Z01812 Encounter for preprocedural laboratory examination: Secondary | ICD-10-CM | POA: Diagnosis present

## 2019-02-18 HISTORY — DX: Transient cerebral ischemic attack, unspecified: G45.9

## 2019-02-18 HISTORY — DX: Unspecified convulsions: R56.9

## 2019-02-18 LAB — SARS CORONAVIRUS 2 (TAT 6-24 HRS): SARS Coronavirus 2: NEGATIVE

## 2019-02-18 NOTE — Patient Instructions (Signed)
Your procedure is scheduled on: Wednesday 02/23/19 Report to [facility]. To find out your arrival time please call [phone] between 1PM - 3PM on Tuesday 02/22/19.  Remember: Instructions that are not followed completely may result in serious medical risk, up to and including death, or upon the discretion of your surgeon and anesthesiologist your surgery may need to be rescheduled.     _X__ 1. Do not eat food after midnight the night before your procedure.                 No gum chewing or hard candies. You may drink clear liquids up to 2 hours                 before you are scheduled to arrive for your surgery- DO not drink clear                 liquids within 2 hours of the start of your surgery.                 Clear Liquids include:  water, apple juice without pulp, clear carbohydrate                 drink such as Clearfast or Gatorade, Black Coffee or Tea (Do not add                 anything to coffee or tea). Diabetics water only  __X__2.  On the morning of surgery brush your teeth with toothpaste and water, you                 may rinse your mouth with mouthwash if you wish.  Do not swallow any              toothpaste of mouthwash.     _X__ 3.  No Alcohol for 24 hours before or after surgery.   _X__ 4.  Do Not Smoke or use e-cigarettes For 24 Hours Prior to Your Surgery.                 Do not use any chewable tobacco products for at least 6 hours prior to                 surgery.  ____  5.  Bring all medications with you on the day of surgery if instructed.   __X__  6.  Notify your doctor if there is any change in your medical condition      (cold, fever, infections).     Do not wear jewelry, make-up, hairpins, clips or nail polish. Do not wear lotions, powders, or perfumes.  Do not shave 48 hours prior to surgery. Men may shave face and neck. Do not bring valuables to the hospital.    [facility] is not responsible  for any belongings or valuables.  Contacts, dentures/partials or body piercings may not be worn into surgery. Bring a case for your contacts, glasses or hearing aids, a denture cup will be supplied. Leave your suitcase in the car. After surgery it may be brought to your room. For patients admitted to the hospital, discharge time is determined by your treatment team.   Patients discharged the day of surgery will not be allowed to drive home.   Please read over the following fact sheets that you were given:   MRSA Information  __X__ Take these medicines the morning of surgery with A SIP OF WATER:  1, gabapentin (NEURONTIN  2.   3.   4.  5.  6.  ____ Fleet Enema (as directed)   __X__ Use CHG Soap/SAGE wipes as directed  __X__ Use inhalers on the day of surgery  ____ Stop metformin/Janumet/Farxiga 2 days prior to surgery    ____ Take 1/2 of usual insulin dose the night before surgery. No insulin the morning          of surgery.   ____ Stop Blood Thinners Coumadin/Plavix/Xarelto/Pleta/Pradaxa/Eliquis/Effient/Aspirin  on   Or contact your Surgeon, Cardiologist or Medical Doctor regarding  ability to stop your blood thinners  __X__ Stop Anti-inflammatories 7 days before surgery such as Advil, Ibuprofen, Motrin,  BC or Goodies Powder, Naprosyn, Naproxen, Aleve, Aspirin   STOP DICLOFENAC TODAY  __X__ Stop all herbal supplements, fish oil or vitamin E until after surgery.    ____ Bring C-Pap to the hospital.

## 2019-02-22 ENCOUNTER — Encounter: Payer: Self-pay | Admitting: Certified Registered Nurse Anesthetist

## 2019-02-23 ENCOUNTER — Ambulatory Visit
Admission: RE | Admit: 2019-02-23 | Discharge: 2019-02-23 | Disposition: A | Discharge status: Home / Self Care | Payer: Medicare HMO | Attending: General Surgery | Admitting: General Surgery

## 2019-02-23 ENCOUNTER — Encounter
Admission: RE | Disposition: A | Discharge status: Home / Self Care | Payer: Self-pay | Source: Home / Self Care | Attending: General Surgery

## 2019-02-23 DIAGNOSIS — K439 Ventral hernia without obstruction or gangrene: Secondary | ICD-10-CM | POA: Insufficient documentation

## 2019-02-23 DIAGNOSIS — Z5309 Procedure and treatment not carried out because of other contraindication: Secondary | ICD-10-CM | POA: Insufficient documentation

## 2019-02-23 LAB — POCT PREGNANCY, URINE: Preg Test, Ur: NEGATIVE

## 2019-02-23 LAB — URINE DRUG SCREEN, QUALITATIVE (ARMC ONLY)
Amphetamines, Ur Screen: NOT DETECTED
Barbiturates, Ur Screen: NOT DETECTED
Benzodiazepine, Ur Scrn: NOT DETECTED
Cannabinoid 50 Ng, Ur ~~LOC~~: NOT DETECTED
Cocaine Metabolite,Ur ~~LOC~~: POSITIVE — AB
MDMA (Ecstasy)Ur Screen: NOT DETECTED
Methadone Scn, Ur: NOT DETECTED
Opiate, Ur Screen: NOT DETECTED
Phencyclidine (PCP) Ur S: NOT DETECTED
Tricyclic, Ur Screen: NOT DETECTED

## 2019-02-23 SURGERY — REPAIR, HERNIA, VENTRAL, ROBOT-ASSISTED
Anesthesia: General

## 2019-02-23 MED ORDER — FAMOTIDINE 20 MG PO TABS: 20.0000 mg | ORAL_TABLET | Freq: Once | ORAL | Status: DC

## 2019-02-23 MED ORDER — LACTATED RINGERS IV SOLN: INTRAVENOUS | Status: DC

## 2019-02-23 MED ORDER — PROPOFOL 10 MG/ML IV BOLUS
INTRAVENOUS | Status: AC
  Filled 2019-02-23: qty 40

## 2019-02-23 MED ORDER — CEFAZOLIN SODIUM-DEXTROSE 2-4 GM/100ML-% IV SOLN: 2.0000 g | INTRAVENOUS | Status: DC

## 2019-02-23 SURGICAL SUPPLY — 72 items
BLADE SURG 15 STRL LF DISP TIS (BLADE) ×1 IMPLANT
BLADE SURG 15 STRL SS (BLADE) ×2
BLADE SURG SZ11 CARB STEEL (BLADE) ×3 IMPLANT
CANISTER SUCT 1200ML W/VALVE (MISCELLANEOUS) ×3 IMPLANT
CANNULA REDUC XI 12-8 STAPL (CANNULA) ×1
CANNULA REDUC XI 12-8MM STAPL (CANNULA) ×1
CANNULA REDUCER 12-8 DVNC XI (CANNULA) ×1 IMPLANT
CHLORAPREP W/TINT 26 (MISCELLANEOUS) ×3 IMPLANT
COVER TIP SHEARS 8 DVNC (MISCELLANEOUS) ×1 IMPLANT
COVER TIP SHEARS 8MM DA VINCI (MISCELLANEOUS) ×2
COVER WAND RF STERILE (DRAPES) ×3 IMPLANT
DEFOGGER SCOPE WARMER CLEARIFY (MISCELLANEOUS) ×3 IMPLANT
DERMABOND ADVANCED (GAUZE/BANDAGES/DRESSINGS) ×2
DERMABOND ADVANCED .7 DNX12 (GAUZE/BANDAGES/DRESSINGS) ×1 IMPLANT
DRAPE 3/4 80X56 (DRAPES) ×3 IMPLANT
DRAPE ARM DVNC X/XI (DISPOSABLE) ×4 IMPLANT
DRAPE COLUMN DVNC XI (DISPOSABLE) ×1 IMPLANT
DRAPE DA VINCI XI ARM (DISPOSABLE) ×8
DRAPE DA VINCI XI COLUMN (DISPOSABLE) ×2
DRAPE LAPAROTOMY 77X122 PED (DRAPES) ×3 IMPLANT
ELECT CAUTERY BLADE 6.4 (BLADE) ×3 IMPLANT
ELECT REM PT RETURN 9FT ADLT (ELECTROSURGICAL) ×3
ELECTRODE REM PT RTRN 9FT ADLT (ELECTROSURGICAL) ×1 IMPLANT
ETHIBOND 2 0 GREEN CT 2 30IN (SUTURE) ×6 IMPLANT
GLOVE BIO SURGEON STRL SZ 6.5 (GLOVE) ×4 IMPLANT
GLOVE BIO SURGEONS STRL SZ 6.5 (GLOVE) ×2
GLOVE BIOGEL PI IND STRL 6.5 (GLOVE) ×2 IMPLANT
GLOVE BIOGEL PI INDICATOR 6.5 (GLOVE) ×4
GOWN STRL REUS W/ TWL LRG LVL3 (GOWN DISPOSABLE) ×3 IMPLANT
GOWN STRL REUS W/TWL LRG LVL3 (GOWN DISPOSABLE) ×6
GRASPER SUT TROCAR 14GX15 (MISCELLANEOUS) ×3 IMPLANT
IRRIGATOR SUCT 8 DISP DVNC XI (IRRIGATION / IRRIGATOR) IMPLANT
IRRIGATOR SUCTION 8MM XI DISP (IRRIGATION / IRRIGATOR)
IV NS 1000ML (IV SOLUTION)
IV NS 1000ML BAXH (IV SOLUTION) IMPLANT
KIT PINK PAD W/HEAD ARE REST (MISCELLANEOUS) ×3
KIT PINK PAD W/HEAD ARM REST (MISCELLANEOUS) ×1 IMPLANT
KIT TURNOVER KIT A (KITS) ×3 IMPLANT
LABEL OR SOLS (LABEL) ×3 IMPLANT
NEEDLE HYPO 22GX1.5 SAFETY (NEEDLE) ×3 IMPLANT
NEEDLE HYPO 25X1 1.5 SAFETY (NEEDLE) ×3 IMPLANT
NEEDLE VERESS 14GA 120MM (NEEDLE) ×3 IMPLANT
NS IRRIG 500ML POUR BTL (IV SOLUTION) ×3 IMPLANT
OBTURATOR OPTICAL STANDARD 8MM (TROCAR) ×2
OBTURATOR OPTICAL STND 8 DVNC (TROCAR) ×1
OBTURATOR OPTICALSTD 8 DVNC (TROCAR) ×1 IMPLANT
PACK BASIN MINOR ARMC (MISCELLANEOUS) ×3 IMPLANT
PACK LAP CHOLECYSTECTOMY (MISCELLANEOUS) ×3 IMPLANT
PENCIL ELECTRO HAND CTR (MISCELLANEOUS) ×3 IMPLANT
SEAL CANN UNIV 5-8 DVNC XI (MISCELLANEOUS) ×2 IMPLANT
SEAL XI 5MM-8MM UNIVERSAL (MISCELLANEOUS) ×4
SOLUTION ELECTROLUBE (MISCELLANEOUS) ×3 IMPLANT
STAPLER CANNULA SEAL DVNC XI (STAPLE) ×1 IMPLANT
STAPLER CANNULA SEAL XI (STAPLE) ×2
SUT DVC VLOC 3-0 CL 6 P-12 (SUTURE) ×6 IMPLANT
SUT MNCRL 4-0 (SUTURE) ×2
SUT MNCRL 4-0 27XMFL (SUTURE) ×1
SUT MNCRL AB 4-0 PS2 18 (SUTURE) ×3 IMPLANT
SUT PDS AB 0 CT1 27 (SUTURE) ×3 IMPLANT
SUT PROLENE 0 CT 2 (SUTURE) ×6 IMPLANT
SUT VIC AB 0 CT1 36 (SUTURE) ×3 IMPLANT
SUT VIC AB 2-0 SH 27 (SUTURE) ×4
SUT VIC AB 2-0 SH 27XBRD (SUTURE) ×2 IMPLANT
SUT VIC AB 3-0 SH 27 (SUTURE) ×2
SUT VIC AB 3-0 SH 27X BRD (SUTURE) ×1 IMPLANT
SUT VICRYL 0 AB UR-6 (SUTURE) ×3 IMPLANT
SUTURE MNCRL 4-0 27XMF (SUTURE) ×1 IMPLANT
SYR 10ML LL (SYRINGE) ×3 IMPLANT
SYR 30ML LL (SYRINGE) ×3 IMPLANT
TRAY FOLEY MTR SLVR 16FR STAT (SET/KITS/TRAYS/PACK) ×3 IMPLANT
TROCAR XCEL NON-BLD 5MMX100MML (ENDOMECHANICALS) ×3 IMPLANT
TUBING EVAC SMOKE HEATED PNEUM (TUBING) ×3 IMPLANT

## 2019-02-23 NOTE — Progress Notes (Signed)
Pt endorsed cocaine use <24 hours ago.  Pt will call office today to reschedule.  Pt d/c'ed home, pt expressed understanding.

## 2019-02-23 NOTE — OR Nursing (Signed)
Dr. Windell Moment, states to cancel d/t pt admitting to cocaine use yesterday. UDS pending results.

## 2019-03-04 ENCOUNTER — Other Ambulatory Visit
Admission: RE | Admit: 2019-03-04 | Discharge: 2019-03-04 | Disposition: A | Discharge status: Home / Self Care | Payer: Medicare HMO | Source: Ambulatory Visit | Attending: General Surgery | Admitting: General Surgery

## 2019-03-04 ENCOUNTER — Other Ambulatory Visit: Payer: Self-pay

## 2019-03-04 DIAGNOSIS — Z01812 Encounter for preprocedural laboratory examination: Secondary | ICD-10-CM | POA: Diagnosis present

## 2019-03-04 DIAGNOSIS — Z20828 Contact with and (suspected) exposure to other viral communicable diseases: Secondary | ICD-10-CM | POA: Diagnosis not present

## 2019-03-04 LAB — SARS CORONAVIRUS 2 (TAT 6-24 HRS): SARS Coronavirus 2: NEGATIVE

## 2019-03-07 ENCOUNTER — Ambulatory Visit: Payer: Self-pay | Admitting: General Surgery

## 2019-03-09 ENCOUNTER — Ambulatory Visit: Admission: RE | Admit: 2019-03-09 | Payer: Medicare HMO | Source: Home / Self Care | Admitting: General Surgery

## 2019-03-09 ENCOUNTER — Encounter: Admission: RE | Payer: Self-pay | Source: Home / Self Care

## 2019-03-09 ENCOUNTER — Telehealth: Payer: Self-pay

## 2019-03-09 SURGERY — REPAIR, HERNIA, VENTRAL, ROBOT-ASSISTED
Anesthesia: General | Site: Abdomen

## 2019-03-09 NOTE — Telephone Encounter (Signed)
Spoke with patient and she advised she had been sick since yesterday with cough, headache, runny nose, really hot, shortness of breath. Contacted surgeon and he cancelled her surgery. He advised her to see PCP and reschedule surgery. Message given to pt. And she verbalized an understanding.

## 2019-07-25 ENCOUNTER — Other Ambulatory Visit: Payer: Medicare HMO

## 2019-10-03 ENCOUNTER — Ambulatory Visit: Payer: Self-pay | Admitting: General Surgery

## 2019-10-03 NOTE — H&P (Signed)
HISTORY OF PRESENT ILLNESS:    Alisha Taylor is a 44 y.o.female patient who comes for reevaluation of ventral hernias.  Patient evaluated previously October 2020 due to ventral hernia.  She has been dealing with a ventral hernia since at least 37-month ago.  She reported that the area is very uncomfortable.  Mainly on the epigastric area.  There is no pain radiation.  There is also discomfort in the umbilical area.  Pain is aggravated by heavy lifting.  There is no alleviating factor.      PAST MEDICAL HISTORY:      Past Medical History:  Diagnosis Date  . Anxiety   . Back pain   . History of stroke   . Seizures (CMS-HCC)    not often        PAST SURGICAL HISTORY:        Past Surgical History:  Procedure Laterality Date  . ESSURE TUBAL LIGATION  11/03/2000         MEDICATIONS:  Encounter Medications        Outpatient Encounter Medications as of 10/03/2019  Medication Sig Dispense Refill  . busPIRone (BUSPAR) 15 MG tablet     . gabapentin (NEURONTIN) 600 MG tablet Take 2 tablets by mouth 3 (three) times daily    . hydrocortisone (ANUSOL-HC) 2.5 % rectal cream     . lamoTRIgine (LAMICTAL) 100 MG tablet Take 2 tablets by mouth once daily    . metroNIDAZOLE (METROGEL) 0.75 % vaginal gel every 14 (fourteen) days    . tiZANidine (ZANAFLEX) 4 MG tablet Take 1 tablet by mouth every 6 (six) hours as needed    . VENTOLIN HFA 90 mcg/actuation inhaler every 6 (six) hours as needed    . [DISCONTINUED] busPIRone (BUSPAR) 10 MG tablet Take 1 tablet by mouth 2 (two) times daily     No facility-administered encounter medications on file as of 10/03/2019.       ALLERGIES:   Patient has no known allergies.   SOCIAL HISTORY:  Social History          Socioeconomic History  . Marital status: Life Partner    Spouse name: Not on file  . Number of children: Not on file  . Years of education: Not on file  . Highest education level: Not on file  Occupational  History  . Not on file  Tobacco Use  . Smoking status: Current Some Day Smoker  . Smokeless tobacco: Never Used  Vaping Use  . Vaping Use: Never used  Substance and Sexual Activity  . Alcohol use: Yes    Alcohol/week: 11.0 standard drinks    Types: 8 Cans of beer, 3 Shots of liquor per week  . Drug use: Yes    Types: Cocaine  . Sexual activity: Not on file  Other Topics Concern  . Not on file  Social History Narrative  . Not on file   Social Determinants of Health      Financial Resource Strain:   . Difficulty of Paying Living Expenses:   Food Insecurity:   . Worried About Programme researcher, broadcasting/film/video in the Last Year:   . Barista in the Last Year:   Transportation Needs:   . Freight forwarder (Medical):   Marland Kitchen Lack of Transportation (Non-Medical):       FAMILY HISTORY:       Family History  Problem Relation Age of Onset  . Diabetes Mother   . Diabetes Father  GENERAL REVIEW OF SYSTEMS:   General ROS: negative for - chills, fatigue, fever, weight gain or weight loss Allergy and Immunology ROS: negative for - hives  Hematological and Lymphatic ROS: negative for - bleeding problems or bruising, negative for palpable nodes Endocrine ROS: negative for - heat or cold intolerance, hair changes Respiratory ROS: negative for - cough, shortness of breath or wheezing Cardiovascular ROS: no chest pain or palpitations GI ROS: negative for nausea, vomiting, abdominal pain, diarrhea, constipation Musculoskeletal ROS: negative for - joint swelling or muscle pain Neurological ROS: negative for - confusion, syncope Dermatological ROS: negative for pruritus and rash  PHYSICAL EXAM:     Vitals:   10/03/19 1500  BP: 98/68  Pulse: 83  .  Ht:167.6 cm (5\' 6" ) Wt:80.7 kg (178 lb) EGB:TDVV surface area is 1.94 meters squared. Body mass index is 28.73 kg/m.Marland Kitchen   GENERAL: Alert, active, oriented x3  HEENT: Pupils equal reactive to light. Extraocular  movements are intact. Sclera clear. Palpebral conjunctiva normal red color.Pharynx clear.  NECK: Supple with no palpable mass and no adenopathy.  LUNGS: Sound clear with no rales rhonchi or wheezes.  HEART: Regular rhythm S1 and S2 without murmur.  ABDOMEN: Soft and depressible, nontender with no palpable mass, no hepatomegaly.  Palpable lump in the epigastric area, not able to be reduced.  Small umbilical hernia, reducible.  EXTREMITIES: Well-developed well-nourished symmetrical with no dependent edema.  NEUROLOGICAL: Awake alert oriented, facial expression symmetrical, moving all extremities.      IMPRESSION:     Ventral hernia without obstruction or gangrene [K43.9] -Patient with persistent pain to epigastric area due to incarcerated hernia.  No sign of torsion of this moment.  Have discussed with the patient the benefits, goals and risk of surgery.  Alternative observation will also discussed with the patient.  Due to her symptoms she is requesting to have the hernia repaired.  I discussed with the patient the risk of injury to bowel, bowel obstruction, infection, bleeding, need of open surgery, pain, among others.  See report understood and agreed to proceed.          PLAN:  1. Robotic assisted laparoscopic umbilical hernia repair with mesh ([id number]) 2. Ventral hernia repair [phone]) 3. Stop plasma donation for 2 weeks before and 2 weeks after the surgery 4. Do not take aspirin 5 days before the surgery 5. CBC, CMP 6. Contact us if has any question or concern.   Patient verbalized understanding, all questions were answered, and were agreeable with the plan outlined above.   Herbert Pun, MD  Electronically signed by Herbert Pun, MD

## 2019-10-13 ENCOUNTER — Other Ambulatory Visit
Admission: RE | Admit: 2019-10-13 | Discharge: 2019-10-13 | Disposition: A | Discharge status: Home / Self Care | Payer: Medicare HMO | Source: Ambulatory Visit | Attending: General Surgery | Admitting: General Surgery

## 2019-10-13 ENCOUNTER — Other Ambulatory Visit: Payer: Self-pay

## 2019-10-13 HISTORY — DX: Headache, unspecified: R51.9

## 2019-10-13 HISTORY — DX: Depression, unspecified: F32.A

## 2019-10-13 NOTE — Patient Instructions (Signed)
Your procedure is scheduled on: Wednesday October 19, 2019. Report to Day Surgery inside [facility] 2nd floor. To find out your arrival time please call [phone] between 1PM - 3PM on Tuesday October 18, 2019.  Remember: Instructions that are not followed completely may result in serious medical risk,  up to and including death, or upon the discretion of your surgeon and anesthesiologist your  surgery may need to be rescheduled.     _X__ 1. Do not eat food after midnight the night before your procedure.                 No gum chewing or hard candies. You may drink clear liquids up to 2 hours                 before you are scheduled to arrive for your surgery- DO not drink clear                 liquids within 2 hours of the start of your surgery.                 Clear Liquids include:  water, apple juice without pulp, clear Gatorade, G2 or                  Gatorade Zero (avoid Red/Purple/Blue), Black Coffee or Tea (Do not add                 anything to coffee or tea).  __X__2.  On the morning of surgery brush your teeth with toothpaste and water, you                may rinse your mouth with mouthwash if you wish.  Do not swallow any toothpaste of mouthwash.     _X__ 3.  No Alcohol for 24 hours before or after surgery.   _X__ 4.  Do Not Smoke or use e-cigarettes For 24 Hours Prior to Your Surgery.                 Do not use any chewable tobacco products for at least 6 hours prior to                 Surgery.  _X__  5.  Do not use any recreational drugs (marijuana, cocaine, heroin, ecstacy, MDMA or other)                For at least one week prior to your surgery.  Combination of these drugs with anesthesia                May have life threatening results.  __X__ 6.  Notify your doctor if there is any change in your medical condition      (cold, fever, infections).     Do not wear jewelry, make-up, hairpins, clips or nail polish. Do not wear lotions, powders, or  perfumes. You may wear deodorant. Do not shave 48 hours prior to surgery. Men may shave face and neck. Do not bring valuables to the hospital.    [facility] is not responsible for any belongings or valuables.  Contacts, dentures or bridgework may not be worn into surgery. Leave your suitcase in the car. After surgery it may be brought to your room. For patients admitted to the hospital, discharge time is determined by your treatment team.   Patients discharged the day of surgery will not be allowed to drive home.   Make arrangements for someone to be  with you for the first 24 hours of your Same Day Discharge.   __X__ Take these medicines the morning of surgery with A SIP OF WATER:    1. BUSPIRONE HCL PO  2. gabapentin (NEURONTIN) 600 MG  3. Lurasidone HCl (LATUDA PO)   __X__ Use CHG Soap as directed  __X__ Use inhalers on the day of surgery  albuterol (VENTOLIN HFA) 108 (90 Base) MCG/ACT inhaler  __X__ Stop Anti-inflammatories such as diclofenac (VOLTAREN), ibuprofen, Aleve, Advil, naproxen, aspirin and or BC powders.    __X__ Stop supplements until after surgery.    __X__ Do not start any herbal supplements before your surgery.

## 2019-10-17 ENCOUNTER — Other Ambulatory Visit: Payer: Self-pay

## 2019-10-17 ENCOUNTER — Other Ambulatory Visit
Admission: RE | Admit: 2019-10-17 | Discharge: 2019-10-17 | Disposition: A | Discharge status: Home / Self Care | Payer: Medicare HMO | Source: Ambulatory Visit | Attending: General Surgery | Admitting: General Surgery

## 2019-10-17 DIAGNOSIS — Z20822 Contact with and (suspected) exposure to covid-19: Secondary | ICD-10-CM | POA: Diagnosis not present

## 2019-10-17 DIAGNOSIS — Z01812 Encounter for preprocedural laboratory examination: Secondary | ICD-10-CM | POA: Diagnosis present

## 2019-10-18 ENCOUNTER — Encounter: Payer: Self-pay | Admitting: Anesthesiology

## 2019-10-18 LAB — SARS CORONAVIRUS 2 (TAT 6-24 HRS): SARS Coronavirus 2: NEGATIVE

## 2019-10-19 ENCOUNTER — Ambulatory Visit: Admission: RE | Admit: 2019-10-19 | Payer: Medicare HMO | Source: Home / Self Care | Admitting: General Surgery

## 2019-10-19 ENCOUNTER — Encounter: Admission: RE | Payer: Self-pay | Source: Home / Self Care

## 2019-10-19 SURGERY — REPAIR, HERNIA, UMBILICAL, ROBOT-ASSISTED
Anesthesia: General

## 2019-10-19 MED ORDER — FAMOTIDINE 20 MG PO TABS: 20.0000 mg | ORAL_TABLET | Freq: Once | ORAL | Status: DC

## 2019-10-19 MED ORDER — CHLORHEXIDINE GLUCONATE 0.12 % MT SOLN: 15.0000 mL | Freq: Once | OROMUCOSAL | Status: DC

## 2019-10-19 MED ORDER — ORAL CARE MOUTH RINSE: 15.0000 mL | Freq: Once | OROMUCOSAL | Status: DC

## 2019-10-19 MED ORDER — LACTATED RINGERS IV SOLN: INTRAVENOUS | Status: DC

## 2019-10-19 MED ORDER — CEFAZOLIN SODIUM-DEXTROSE 2-4 GM/100ML-% IV SOLN: 2.0000 g | INTRAVENOUS | Status: DC

## 2019-10-19 NOTE — OR Nursing (Signed)
Dr. Hazle Quant, states to cancel case since pt ate an apple and over slept. Denise to let pt know.

## 2019-10-19 NOTE — Progress Notes (Signed)
Patient no show for scheduled surgery today; at 6:30 am call placed to the patient and she stated that she overslept. Stated that she wouldn't be able to get to the hospital for about an hour 1/2 due to transportation. She also stated that she had eaten an apple. OR team notified.

## 2019-12-06 ENCOUNTER — Ambulatory Visit: Payer: Self-pay | Admitting: General Surgery

## 2019-12-06 NOTE — H&P (View-Only) (Signed)
HISTORY OF PRESENT ILLNESS:    Alisha Taylor is a 44 y.o.female patient who comes for reevaluation of incisional and umbilical hernia.  Patient has previously evaluated for surgical management of ventral (epigastric) and umbilical hernia.  She has been scheduled for surgery in October 2020.  At the moment she was positive for cocaine and the surgery was rescheduled for November.  On November she called the day before the surgery reported that she was having fever and cough.  Recommendation was to see primary care and the surgery was canceled.  The patient came back for evaluation and she was scheduled to have elective surgery on October 03, 2019.  The day of the surgery should call that she did not have a ride to get to the hospital and that she ate an apple.  Surgery was canceled again.  The patient comes today for evaluation of the hernia and discussion for surgery.  She reports that she continued having discomfort around the epigastric hernia.  She was also having some pain in the umbilical hernia.  There is no radiation.  Aggravating factor is doing certain movement of the abdominal wall.  There is no alleviating factors identified.  Patient denies abdominal distention nausea or vomiting.  Patient is tolerating regular diet and having regular bowel movement.      PAST MEDICAL HISTORY:      Past Medical History:  Diagnosis Date  . Anxiety   . Back pain   . History of stroke   . Seizures (CMS-HCC)    not often        PAST SURGICAL HISTORY:        Past Surgical History:  Procedure Laterality Date  . ESSURE TUBAL LIGATION  11/03/2000         MEDICATIONS:  Encounter Medications        Outpatient Encounter Medications as of 12/06/2019  Medication Sig Dispense Refill  . busPIRone (BUSPAR) 15 MG tablet     . cholecalciferol (VITAMIN D3) 1000 unit capsule Take by mouth    . gabapentin (NEURONTIN) 600 MG tablet Take 2 tablets by mouth 3 (three) times daily    . hydrocortisone  (ANUSOL-HC) 2.5 % rectal cream     . lamoTRIgine (LAMICTAL) 100 MG tablet Take 2 tablets by mouth once daily    . metroNIDAZOLE (METROGEL) 0.75 % vaginal gel every 14 (fourteen) days    . tiZANidine (ZANAFLEX) 4 MG tablet Take 1 tablet by mouth every 6 (six) hours as needed    . VENTOLIN HFA 90 mcg/actuation inhaler every 6 (six) hours as needed     No facility-administered encounter medications on file as of 12/06/2019.       ALLERGIES:   Patient has no known allergies.   SOCIAL HISTORY:  Social History          Socioeconomic History  . Marital status: Life Partner    Spouse name: Not on file  . Number of children: Not on file  . Years of education: Not on file  . Highest education level: Not on file  Occupational History  . Not on file  Tobacco Use  . Smoking status: Current Some Day Smoker  . Smokeless tobacco: Never Used  Vaping Use  . Vaping Use: Never used  Substance and Sexual Activity  . Alcohol use: Yes    Alcohol/week: 11.0 standard drinks    Types: 8 Cans of beer, 3 Shots of liquor per week  . Drug use: Yes    Types: Cocaine  .  Sexual activity: Not on file  Other Topics Concern  . Not on file  Social History Narrative  . Not on file   Social Determinants of Health      Financial Resource Strain:   . Difficulty of Paying Living Expenses:   Food Insecurity:   . Worried About Running Out of Food in the Last Year:   . Ran Out of Food in the Last Year:   Transportation Needs:   . Lack of Transportation (Medical):   . Lack of Transportation (Non-Medical):       FAMILY HISTORY:       Family History  Problem Relation Age of Onset  . Diabetes Mother   . Diabetes Father      GENERAL REVIEW OF SYSTEMS:   General ROS: negative for - chills, fatigue, fever, weight gain or weight loss Allergy and Immunology ROS: negative for - hives  Hematological and Lymphatic ROS: negative for - bleeding problems or bruising, negative  for palpable nodes Endocrine ROS: negative for - heat or cold intolerance, hair changes Respiratory ROS: negative for - cough, shortness of breath or wheezing Cardiovascular ROS: no chest pain or palpitations GI ROS: negative for nausea, vomiting, abdominal pain, diarrhea, constipation Musculoskeletal ROS: negative for - joint swelling or muscle pain Neurological ROS: negative for - confusion, syncope Dermatological ROS: negative for pruritus and rash  PHYSICAL EXAM:     Vitals:   12/06/19 0918  BP: 115/78  Pulse: 67  .  Ht:167.6 cm (5' 6") Wt:80.7 kg (178 lb) BSA:Body surface area is 1.94 meters squared. Body mass index is 28.73 kg/m..   GENERAL: Alert, active, oriented x3  HEENT: Pupils equal reactive to light. Extraocular movements are intact. Sclera clear. Palpebral conjunctiva normal red color.Pharynx clear.  NECK: Supple with no palpable mass and no adenopathy.  LUNGS: Sound clear with no rales rhonchi or wheezes.  HEART: Regular rhythm S1 and S2 without murmur.  ABDOMEN: Soft and depressible, nontender with no palpable mass, no hepatomegaly.  Palpable epigastric hernia, palpable umbilical hernia  EXTREMITIES: Well-developed well-nourished symmetrical with no dependent edema.  NEUROLOGICAL: Awake alert oriented, facial expression symmetrical, moving all extremities.      IMPRESSION:     Ventral hernia without obstruction or gangrene [K43.9]   Patient reevaluating again I discussed about surgical management of epigastric and umbilical hernia.  Patient was arranged that this will be the last opportunity to proceed with elective surgery since she has canceled the last 3 times for the above-mentioned reasons.  Patient understood.  Patient was again oriented about the surgical management including robotic assisted laparoscopic umbilical hernia and epigastric hernia.  I discussed with the patient the concept of closing the hernia defect and placing a mesh.  I  discussed with the patient the potential risk of injury to bowel, injury to vasculature, bleeding, infection, bowel obstruction, intra-abdominal abscess, pain, among others.  Patient understood and agreed to proceed.         PLAN:  1. Robotic assisted laparoscopic umbilical hernia repair with mesh (49652) 2. Ventral hernia repair (49561) 3. Do not take aspirin 5 days before the surgery 4. CBC, CMP 5. Contact us if has any question or concern.  Patient verbalized understanding, all questions were answered, and were agreeable with the plan outlined above.   Alisha Hornig Cintron-Diaz, MD  Electronically signed by Alisha Shimabukuro Cintron-Diaz, MD  

## 2019-12-06 NOTE — H&P (Signed)
HISTORY OF PRESENT ILLNESS:    [name] is a 44 y.o.female patient who comes for reevaluation of incisional and umbilical hernia.  Patient has previously evaluated for surgical management of ventral (epigastric) and umbilical hernia.  She has been scheduled for surgery in October 2020.  At the moment she was positive for cocaine and the surgery was rescheduled for November.  On November she called the day before the surgery reported that she was having fever and cough.  Recommendation was to see primary care and the surgery was canceled.  The patient came back for evaluation and she was scheduled to have elective surgery on October 03, 2019.  The day of the surgery should call that she did not have a ride to get to the hospital and that she ate an apple.  Surgery was canceled again.  The patient comes today for evaluation of the hernia and discussion for surgery.  She reports that she continued having discomfort around the epigastric hernia.  She was also having some pain in the umbilical hernia.  There is no radiation.  Aggravating factor is doing certain movement of the abdominal wall.  There is no alleviating factors identified.  Patient denies abdominal distention nausea or vomiting.  Patient is tolerating regular diet and having regular bowel movement.      PAST MEDICAL HISTORY:      Past Medical History:  Diagnosis Date  . Anxiety   . Back pain   . History of stroke   . Seizures (CMS-HCC)    not often        PAST SURGICAL HISTORY:        Past Surgical History:  Procedure Laterality Date  . ESSURE TUBAL LIGATION  11/03/2000         MEDICATIONS:  Encounter Medications        Outpatient Encounter Medications as of 12/06/2019  Medication Sig Dispense Refill  . busPIRone (BUSPAR) 15 MG tablet     . cholecalciferol (VITAMIN D3) 1000 unit capsule Take by mouth    . gabapentin (NEURONTIN) 600 MG tablet Take 2 tablets by mouth 3 (three) times daily    . hydrocortisone  (ANUSOL-HC) 2.5 % rectal cream     . lamoTRIgine (LAMICTAL) 100 MG tablet Take 2 tablets by mouth once daily    . metroNIDAZOLE (METROGEL) 0.75 % vaginal gel every 14 (fourteen) days    . tiZANidine (ZANAFLEX) 4 MG tablet Take 1 tablet by mouth every 6 (six) hours as needed    . VENTOLIN HFA 90 mcg/actuation inhaler every 6 (six) hours as needed     No facility-administered encounter medications on file as of 12/06/2019.       ALLERGIES:   Patient has no known allergies.   SOCIAL HISTORY:  Social History          Socioeconomic History  . Marital status: Life Partner    Spouse name: Not on file  . Number of children: Not on file  . Years of education: Not on file  . Highest education level: Not on file  Occupational History  . Not on file  Tobacco Use  . Smoking status: Current Some Day Smoker  . Smokeless tobacco: Never Used  Vaping Use  . Vaping Use: Never used  Substance and Sexual Activity  . Alcohol use: Yes    Alcohol/week: 11.0 standard drinks    Types: 8 Cans of beer, 3 Shots of liquor per week  . Drug use: Yes    Types: Cocaine  .  Sexual activity: Not on file  Other Topics Concern  . Not on file  Social History Narrative  . Not on file   Social Determinants of Health      Financial Resource Strain:   . Difficulty of Paying Living Expenses:   Food Insecurity:   . Worried About Programme researcher, broadcasting/film/video in the Last Year:   . Barista in the Last Year:   Transportation Needs:   . Freight forwarder (Medical):   Marland Kitchen Lack of Transportation (Non-Medical):       FAMILY HISTORY:       Family History  Problem Relation Age of Onset  . Diabetes Mother   . Diabetes Father      GENERAL REVIEW OF SYSTEMS:   General ROS: negative for - chills, fatigue, fever, weight gain or weight loss Allergy and Immunology ROS: negative for - hives  Hematological and Lymphatic ROS: negative for - bleeding problems or bruising, negative  for palpable nodes Endocrine ROS: negative for - heat or cold intolerance, hair changes Respiratory ROS: negative for - cough, shortness of breath or wheezing Cardiovascular ROS: no chest pain or palpitations GI ROS: negative for nausea, vomiting, abdominal pain, diarrhea, constipation Musculoskeletal ROS: negative for - joint swelling or muscle pain Neurological ROS: negative for - confusion, syncope Dermatological ROS: negative for pruritus and rash  PHYSICAL EXAM:     Vitals:   12/06/19 0918  BP: 115/78  Pulse: 67  .  Ht:167.6 cm (5\' 6" ) Wt:80.7 kg (178 lb) surface area is 1.94 meters squared. Body mass index is 28.73 kg/m.EZM:OQHU   GENERAL: Alert, active, oriented x3  HEENT: Pupils equal reactive to light. Extraocular movements are intact. Sclera clear. Palpebral conjunctiva normal red color.Pharynx clear.  NECK: Supple with no palpable mass and no adenopathy.  LUNGS: Sound clear with no rales rhonchi or wheezes.  HEART: Regular rhythm S1 and S2 without murmur.  ABDOMEN: Soft and depressible, nontender with no palpable mass, no hepatomegaly.  Palpable epigastric hernia, palpable umbilical hernia  EXTREMITIES: Well-developed well-nourished symmetrical with no dependent edema.  NEUROLOGICAL: Awake alert oriented, facial expression symmetrical, moving all extremities.      IMPRESSION:     Ventral hernia without obstruction or gangrene [K43.9]   Patient reevaluating again I discussed about surgical management of epigastric and umbilical hernia.  Patient was arranged that this will be the last opportunity to proceed with elective surgery since she has canceled the last 3 times for the above-mentioned reasons.  Patient understood.  Patient was again oriented about the surgical management including robotic assisted laparoscopic umbilical hernia and epigastric hernia.  I discussed with the patient the concept of closing the hernia defect and placing a mesh.  I  discussed with the patient the potential risk of injury to bowel, injury to vasculature, bleeding, infection, bowel obstruction, intra-abdominal abscess, pain, among others.  Patient understood and agreed to proceed.         PLAN:  1. Robotic assisted laparoscopic umbilical hernia repair with mesh Marland Kitchen) 2. Ventral hernia repair [phone]) 3. Do not take aspirin 5 days before the surgery 4. CBC, CMP 5. Contact ([phone] if has any question or concern.  Patient verbalized understanding, all questions were answered, and were agreeable with the plan outlined above.   [place], MD  Electronically signed by Carolan Shiver, MD

## 2019-12-13 ENCOUNTER — Inpatient Hospital Stay: Admission: RE | Admit: 2019-12-13 | Payer: Medicare HMO | Source: Ambulatory Visit

## 2019-12-15 ENCOUNTER — Other Ambulatory Visit: Payer: Medicaid Other

## 2019-12-16 ENCOUNTER — Encounter: Admission: RE | Admit: 2019-12-16 | Payer: Medicaid Other | Source: Ambulatory Visit

## 2019-12-16 NOTE — Pre-Procedure Instructions (Signed)
Called 3 different phone numbers including an emergency contact number in attempt to contact patient to do her PAT pre op interview for her upcoming surgery on 12/21/19 without success. This is the second phone interview appointment that has been scheduled for this patient. Will contact office regarding inability to reach patient.

## 2019-12-19 ENCOUNTER — Other Ambulatory Visit: Admission: RE | Admit: 2019-12-19 | Payer: Medicaid Other | Source: Ambulatory Visit

## 2019-12-20 ENCOUNTER — Other Ambulatory Visit: Payer: Self-pay

## 2019-12-20 ENCOUNTER — Other Ambulatory Visit
Admission: RE | Admit: 2019-12-20 | Discharge: 2019-12-20 | Disposition: A | Discharge status: Home / Self Care | Payer: Medicare HMO | Source: Ambulatory Visit | Attending: General Surgery | Admitting: General Surgery

## 2019-12-20 DIAGNOSIS — Z01812 Encounter for preprocedural laboratory examination: Secondary | ICD-10-CM | POA: Insufficient documentation

## 2019-12-20 DIAGNOSIS — Z20822 Contact with and (suspected) exposure to covid-19: Secondary | ICD-10-CM | POA: Diagnosis not present

## 2019-12-21 ENCOUNTER — Ambulatory Visit
Admission: RE | Admit: 2019-12-21 | Discharge: 2019-12-21 | Disposition: A | Discharge status: Home / Self Care | Payer: Medicare HMO | Attending: General Surgery | Admitting: General Surgery

## 2019-12-21 ENCOUNTER — Encounter
Admission: RE | Disposition: A | Discharge status: Home / Self Care | Payer: Self-pay | Source: Home / Self Care | Attending: General Surgery

## 2019-12-21 ENCOUNTER — Encounter: Payer: Self-pay | Admitting: General Surgery

## 2019-12-21 ENCOUNTER — Encounter: Payer: Self-pay | Admitting: Certified Registered"

## 2019-12-21 DIAGNOSIS — F419 Anxiety disorder, unspecified: Secondary | ICD-10-CM | POA: Diagnosis not present

## 2019-12-21 DIAGNOSIS — F17200 Nicotine dependence, unspecified, uncomplicated: Secondary | ICD-10-CM | POA: Diagnosis not present

## 2019-12-21 DIAGNOSIS — Z8673 Personal history of transient ischemic attack (TIA), and cerebral infarction without residual deficits: Secondary | ICD-10-CM | POA: Diagnosis not present

## 2019-12-21 DIAGNOSIS — K439 Ventral hernia without obstruction or gangrene: Secondary | ICD-10-CM | POA: Insufficient documentation

## 2019-12-21 DIAGNOSIS — Z79899 Other long term (current) drug therapy: Secondary | ICD-10-CM | POA: Insufficient documentation

## 2019-12-21 DIAGNOSIS — Z539 Procedure and treatment not carried out, unspecified reason: Secondary | ICD-10-CM | POA: Insufficient documentation

## 2019-12-21 DIAGNOSIS — K429 Umbilical hernia without obstruction or gangrene: Secondary | ICD-10-CM | POA: Diagnosis not present

## 2019-12-21 DIAGNOSIS — F1490 Cocaine use, unspecified, uncomplicated: Secondary | ICD-10-CM | POA: Insufficient documentation

## 2019-12-21 LAB — URINE DRUG SCREEN, QUALITATIVE (ARMC ONLY)
Amphetamines, Ur Screen: NOT DETECTED
Barbiturates, Ur Screen: NOT DETECTED
Benzodiazepine, Ur Scrn: NOT DETECTED
Cannabinoid 50 Ng, Ur ~~LOC~~: NOT DETECTED
Cocaine Metabolite,Ur ~~LOC~~: POSITIVE — AB
MDMA (Ecstasy)Ur Screen: NOT DETECTED
Methadone Scn, Ur: NOT DETECTED
Opiate, Ur Screen: NOT DETECTED
Phencyclidine (PCP) Ur S: NOT DETECTED
Tricyclic, Ur Screen: NOT DETECTED

## 2019-12-21 LAB — SARS CORONAVIRUS 2 (TAT 6-24 HRS): SARS Coronavirus 2: NEGATIVE

## 2019-12-21 SURGERY — REPAIR, HERNIA, UMBILICAL, ROBOT-ASSISTED
Anesthesia: General | Site: Abdomen

## 2019-12-21 MED ORDER — CHLORHEXIDINE GLUCONATE 0.12 % MT SOLN: 15.0000 mL | Freq: Once | OROMUCOSAL | Status: DC

## 2019-12-21 MED ORDER — LACTATED RINGERS IV SOLN: INTRAVENOUS | Status: DC

## 2019-12-21 MED ORDER — ORAL CARE MOUTH RINSE: 15.0000 mL | Freq: Once | OROMUCOSAL | Status: DC

## 2019-12-21 MED ORDER — CEFAZOLIN SODIUM-DEXTROSE 2-4 GM/100ML-% IV SOLN: 2.0000 g | INTRAVENOUS | Status: DC

## 2019-12-21 MED ORDER — FAMOTIDINE 20 MG PO TABS: 20.0000 mg | ORAL_TABLET | Freq: Once | ORAL | Status: DC

## 2019-12-21 SURGICAL SUPPLY — 60 items
BAG INFUSER PRESSURE 100CC (MISCELLANEOUS) IMPLANT
BLADE SURG 15 STRL LF DISP TIS (BLADE) ×1 IMPLANT
BLADE SURG 15 STRL SS (BLADE) ×2
BLADE SURG SZ11 CARB STEEL (BLADE) ×3 IMPLANT
CANISTER SUCT 1200ML W/VALVE (MISCELLANEOUS) ×3 IMPLANT
CHLORAPREP W/TINT 26 (MISCELLANEOUS) ×3 IMPLANT
COVER TIP SHEARS 8 DVNC (MISCELLANEOUS) ×1 IMPLANT
COVER TIP SHEARS 8MM DA VINCI (MISCELLANEOUS) ×2
COVER WAND RF STERILE (DRAPES) ×3 IMPLANT
DEFOGGER SCOPE WARMER CLEARIFY (MISCELLANEOUS) ×3 IMPLANT
DERMABOND ADVANCED (GAUZE/BANDAGES/DRESSINGS) ×2
DERMABOND ADVANCED .7 DNX12 (GAUZE/BANDAGES/DRESSINGS) ×1 IMPLANT
DRAPE ARM DVNC X/XI (DISPOSABLE) ×3 IMPLANT
DRAPE COLUMN DVNC XI (DISPOSABLE) ×1 IMPLANT
DRAPE DA VINCI XI ARM (DISPOSABLE) ×6
DRAPE DA VINCI XI COLUMN (DISPOSABLE) ×2
DRAPE LAPAROTOMY 100X77 ABD (DRAPES) ×3 IMPLANT
ELECT REM PT RETURN 9FT ADLT (ELECTROSURGICAL) ×3
ELECTRODE REM PT RTRN 9FT ADLT (ELECTROSURGICAL) ×1 IMPLANT
GAUZE SPONGE 4X4 12PLY STRL (GAUZE/BANDAGES/DRESSINGS) IMPLANT
GLOVE BIO SURGEON STRL SZ 6.5 (GLOVE) ×4 IMPLANT
GLOVE BIO SURGEONS STRL SZ 6.5 (GLOVE) ×2
GLOVE BIOGEL PI IND STRL 6.5 (GLOVE) ×2 IMPLANT
GLOVE BIOGEL PI INDICATOR 6.5 (GLOVE) ×4
GOWN STRL REUS W/ TWL LRG LVL3 (GOWN DISPOSABLE) ×3 IMPLANT
GOWN STRL REUS W/TWL LRG LVL3 (GOWN DISPOSABLE) ×6
IRRIGATOR SUCT 8 DISP DVNC XI (IRRIGATION / IRRIGATOR) IMPLANT
IRRIGATOR SUCTION 8MM XI DISP (IRRIGATION / IRRIGATOR)
IV NS 1000ML (IV SOLUTION)
IV NS 1000ML BAXH (IV SOLUTION) IMPLANT
KIT PINK PAD W/HEAD ARE REST (MISCELLANEOUS) ×3
KIT PINK PAD W/HEAD ARM REST (MISCELLANEOUS) ×1 IMPLANT
KIT TURNOVER KIT A (KITS) ×3 IMPLANT
LABEL OR SOLS (LABEL) ×3 IMPLANT
NEEDLE HYPO 22GX1.5 SAFETY (NEEDLE) ×3 IMPLANT
NEEDLE HYPO 25X1 1.5 SAFETY (NEEDLE) ×3 IMPLANT
NEEDLE INSUFFLATION 14GA 120MM (NEEDLE) ×3 IMPLANT
NS IRRIG 500ML POUR BTL (IV SOLUTION) ×3 IMPLANT
OBTURATOR OPTICAL STANDARD 8MM (TROCAR) ×2
OBTURATOR OPTICAL STND 8 DVNC (TROCAR) ×1
OBTURATOR OPTICALSTD 8 DVNC (TROCAR) ×1 IMPLANT
PACK BASIN MINOR (MISCELLANEOUS) ×3 IMPLANT
PACK LAP CHOLECYSTECTOMY (MISCELLANEOUS) ×3 IMPLANT
SEAL CANN UNIV 5-8 DVNC XI (MISCELLANEOUS) ×3 IMPLANT
SEAL XI 5MM-8MM UNIVERSAL (MISCELLANEOUS) ×6
SET TUBE SMOKE EVAC HIGH FLOW (TUBING) ×3 IMPLANT
SOLUTION ELECTROLUBE (MISCELLANEOUS) ×3 IMPLANT
SUT MNCRL 4-0 (SUTURE) ×2
SUT MNCRL 4-0 27XMFL (SUTURE) ×1
SUT MNCRL AB 4-0 PS2 18 (SUTURE) ×3 IMPLANT
SUT PDS PLUS 0 (SUTURE) ×2
SUT PDS PLUS AB 0 CT-2 (SUTURE) ×1 IMPLANT
SUT PROLENE 0 CT 2 (SUTURE) ×6 IMPLANT
SUT STRATAFIX PDS 30 CT-1 (SUTURE) ×3 IMPLANT
SUT VIC AB 2-0 SH 27 (SUTURE) ×2
SUT VIC AB 2-0 SH 27XBRD (SUTURE) ×1 IMPLANT
SUT VICRYL 0 AB UR-6 (SUTURE) ×3 IMPLANT
SUT VLOC 90 S/L VL9 GS22 (SUTURE) ×3 IMPLANT
SUTURE MNCRL 4-0 27XMF (SUTURE) ×1 IMPLANT
SYR 10ML LL (SYRINGE) ×3 IMPLANT

## 2019-12-21 NOTE — Interval H&P Note (Signed)
History and Physical Interval Note:  12/21/2019 9:25 AM  Alisha Taylor  has presented today for surgery, with the diagnosis of K43.9 Ventral hernia without obstruction or gangrene.  The various methods of treatment have been discussed with the patient and family. After consideration of risks, benefits and other options for treatment, the patient has consented to  Procedure(s): XI ROBOT ASSISTED UMBILICAL HERNIA REPAIR (N/A) HERNIA REPAIR VENTRAL ADULT (N/A) as a surgical intervention.  The patient's history has been reviewed, patient examined, no change in status, stable for surgery.  I have reviewed the patient's chart and labs.  Due to urine drug test positive for cocaine again, decision was to cancel the surgery due to the high risk of anesthesia.    Carolan Shiver

## 2019-12-22 NOTE — Progress Notes (Signed)
   12/21/19 0810  Clinical Encounter Type  Visited With Family  Visit Type Initial  Referral From Chaplain  Consult/Referral To Chaplain  While rounding [facility] waiting area, chaplain spoke pt's daughter. She said she was fine and had no questions or concerns.

## 2020-02-10 ENCOUNTER — Ambulatory Visit: Payer: Self-pay

## 2020-02-13 ENCOUNTER — Ambulatory Visit: Payer: Self-pay

## 2020-08-05 IMAGING — CT CT ABD-PELV W/ CM
2 of 5 series · 15 of 46 positions shown, 17 images · IV contrast (omnipaque)
Comparison: None.

CLINICAL DATA: Ventral hernia for 2-3 years now with pain.

EXAM:
CT ABDOMEN AND PELVIS WITH CONTRAST
TECHNIQUE: Multidetector CT imaging of the abdomen and pelvis was performed
using the standard protocol following bolus administration of
intravenous contrast.
CONTRAST:  100mL OMNIPAQUE IOHEXOL 300 MG/ML  SOLN

[Series 2: abd pelvis · axial · 0.69mm/px · z∈[-1447,-1062]mm · 12 of 89 slices shown, 14 images]
[im 6/89  soft-tissue]
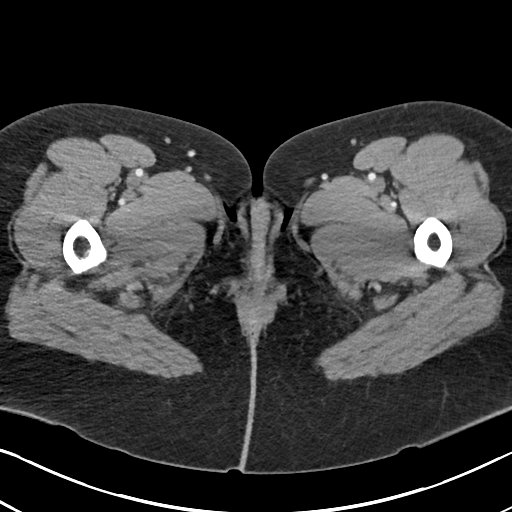
[im 6/89  bone]
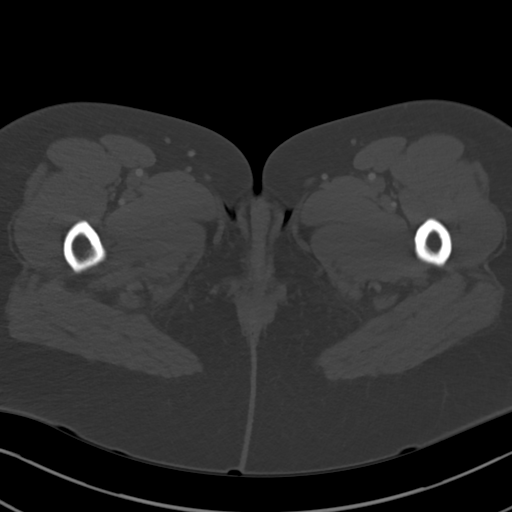
[im 16/89  soft-tissue]
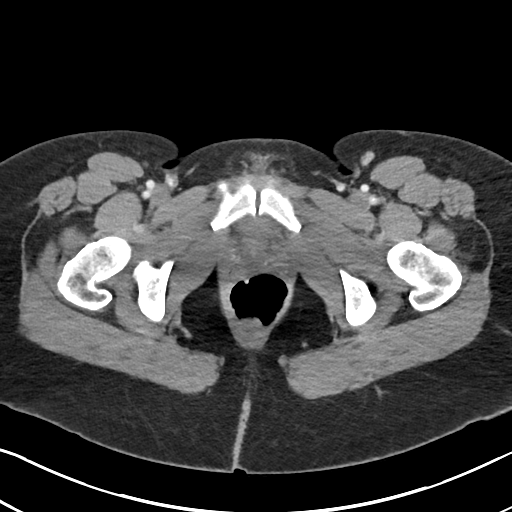
[im 21/89  soft-tissue]
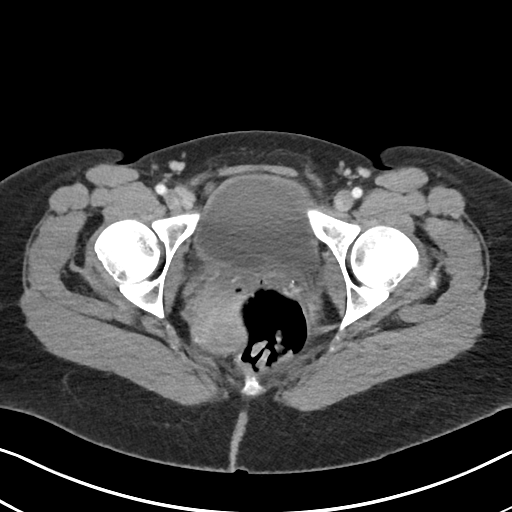
[im 26/89  soft-tissue]
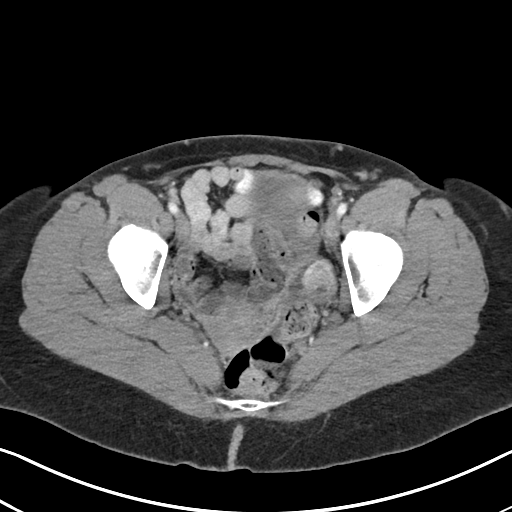
[im 37/89  soft-tissue]
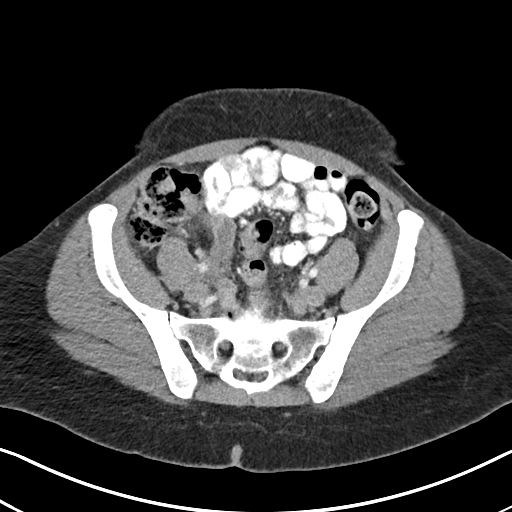
[im 42/89  soft-tissue]
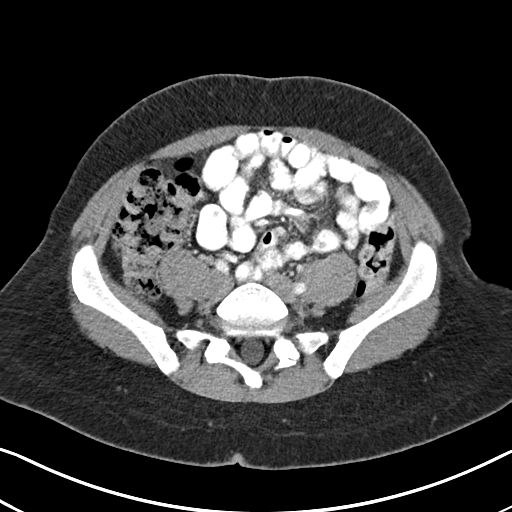
[im 47/89  soft-tissue]
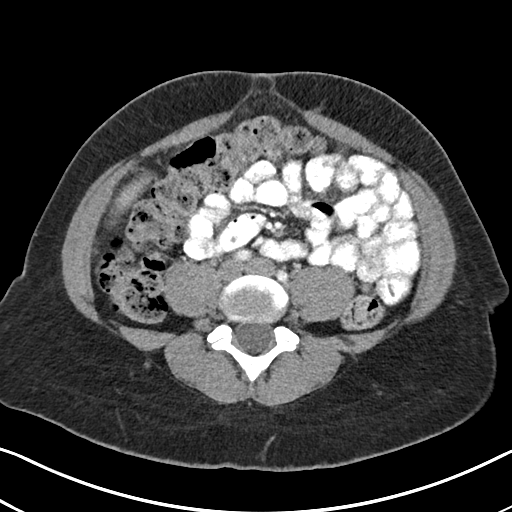
[im 57/89  soft-tissue]
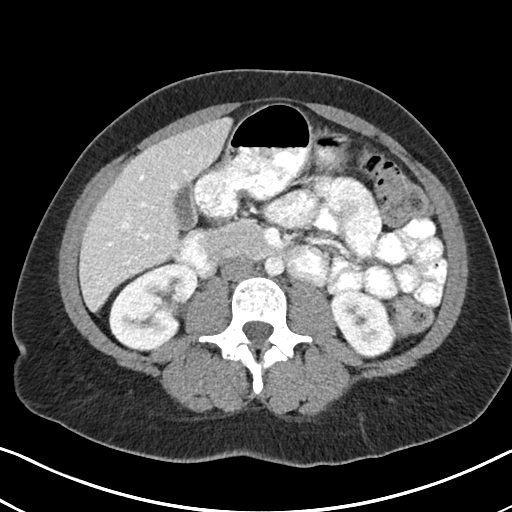
[im 63/89  soft-tissue]
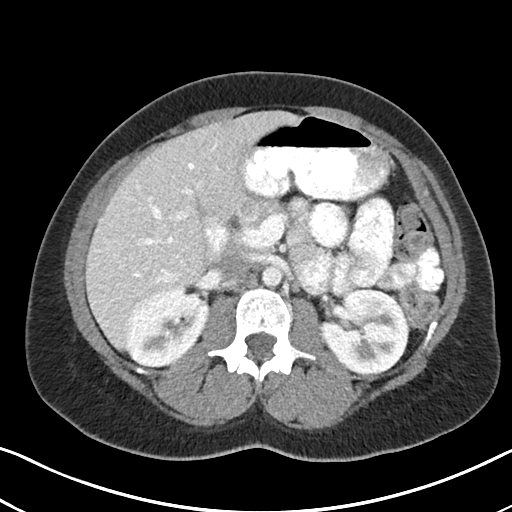
[im 63/89  bone]
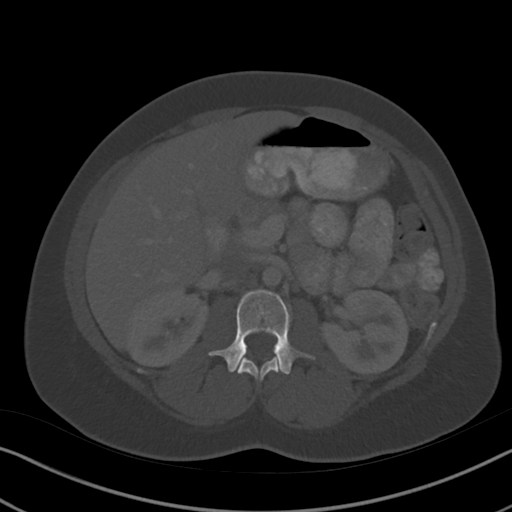
[im 68/89  soft-tissue]
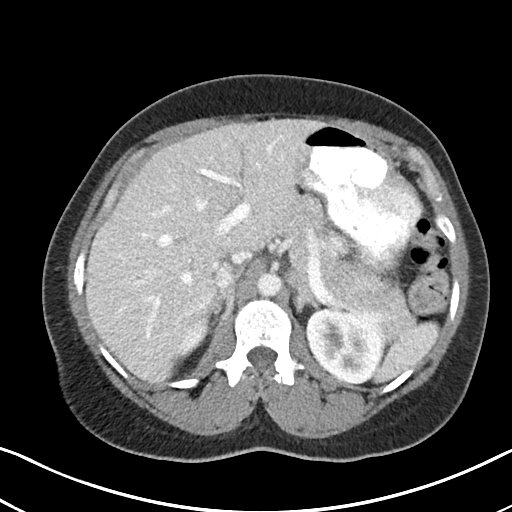
[im 78/89  soft-tissue]
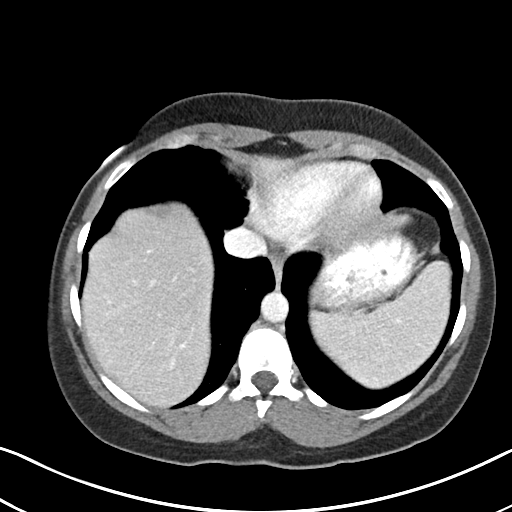
[im 83/89  soft-tissue]
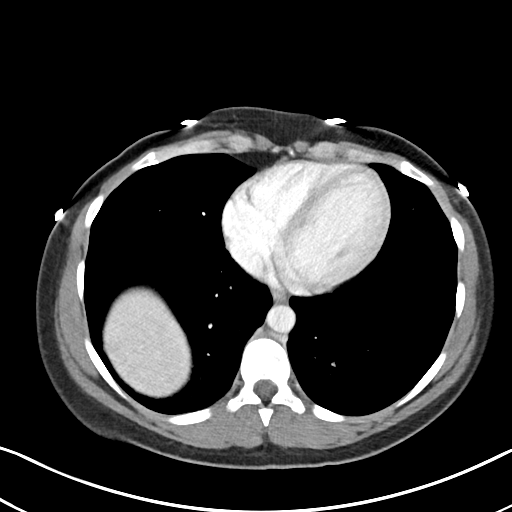

[Series 4: coronals abd pelvis · coronal · 0.69mm/px · 3 of 141 slices shown]
[im 47/141  soft-tissue]
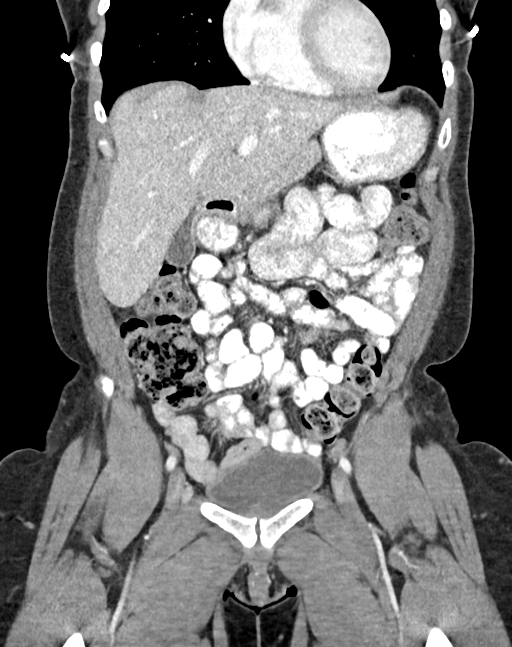
[im 63/141  soft-tissue]
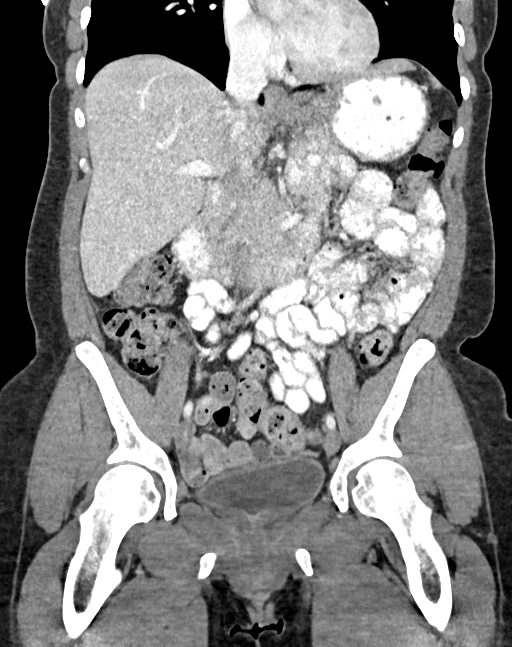
[im 78/141  soft-tissue]
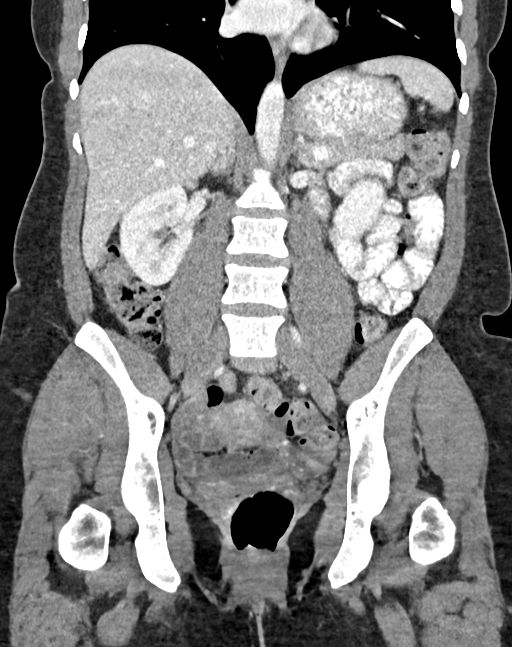

[15 of 46 positions shown; findings below may reference images not displayed]

FINDINGS: Lower chest: Unremarkable.

Hepatobiliary: No suspicious focal abnormality within the liver
parenchyma. There is no evidence for gallstones, gallbladder wall
thickening, or pericholecystic fluid. No intrahepatic or
extrahepatic biliary dilation.

Pancreas: No focal mass lesion. No dilatation of the main duct. No
intraparenchymal cyst. No peripancreatic edema.

Spleen: No splenomegaly. No focal mass lesion.

Adrenals/Urinary Tract: No adrenal nodule or mass. Kidneys
unremarkable. No evidence for hydroureter. The urinary bladder
appears normal for the degree of distention.

Stomach/Bowel: Stomach is unremarkable. No gastric wall thickening.
No evidence of outlet obstruction. Duodenum is normally positioned
as is the ligament of Treitz. No small bowel wall thickening. No
small bowel dilatation. The terminal ileum is normal. The appendix
is not visualized, but there is no edema or inflammation in the
region of the cecum. No gross colonic mass. No colonic wall
thickening.

Vascular/Lymphatic: No abdominal aortic aneurysm. No abdominal
aortic atherosclerotic calcification. There is no gastrohepatic or
hepatoduodenal ligament lymphadenopathy. No intraperitoneal or
retroperitoneal lymphadenopathy. No pelvic sidewall lymphadenopathy.

Reproductive: The uterus is unremarkable.  There is no adnexal mass.

Other: No intraperitoneal free fluid.

Musculoskeletal: A small umbilical hernia contains only fat. 13 cm
cranial to the umbilicus, at the midline, is a tiny fascial defect
in the anterior abdominal wall. This corresponds to the site that
the patient localized prior to CT scanning and was marked with a
vitamin-E capsule. Fascial defect measures on the order of 4-5 mm
with protrusion of preperitoneal/omental fat. Hernia sac measures
2.3 x 2.6 x 2.1 cm. No edema or fluid in the hernia sac to suggest
fat incarceration. No worrisome lytic or sclerotic osseous
abnormality.
IMPRESSION: 1. There is a epigastric ventral hernia in the anterior abdominal
wall 13 cm cranial to the umbilicus. The fascial defect is quite
tiny measuring 4-5 mm with protrusion of preperitoneal/omental fat
generating a hernia sac measuring approximately 2-3 cm. No edema or
fluid in the hernia sac to suggest fat incarceration. This hernia
corresponds to the site localized by the patient prior to imaging.
2. Small fat containing umbilical hernia.
3. Otherwise unremarkable exam.

## 2022-12-23 ENCOUNTER — Other Ambulatory Visit: Payer: Self-pay | Admitting: Family Medicine

## 2022-12-23 DIAGNOSIS — L729 Follicular cyst of the skin and subcutaneous tissue, unspecified: Secondary | ICD-10-CM

## 2023-01-02 ENCOUNTER — Ambulatory Visit: Payer: 59

## 2023-03-02 ENCOUNTER — Emergency Department: Payer: 59

## 2023-03-02 ENCOUNTER — Other Ambulatory Visit: Payer: Self-pay

## 2023-03-02 ENCOUNTER — Encounter: Payer: Self-pay | Admitting: Emergency Medicine

## 2023-03-02 ENCOUNTER — Emergency Department
Admission: EM | Admit: 2023-03-02 | Discharge: 2023-03-02 | Disposition: A | Discharge status: Home / Self Care | Payer: 59 | Attending: Emergency Medicine | Admitting: Emergency Medicine

## 2023-03-02 DIAGNOSIS — S0990XA Unspecified injury of head, initial encounter: Secondary | ICD-10-CM | POA: Diagnosis present

## 2023-03-02 DIAGNOSIS — S060X1A Concussion with loss of consciousness of 30 minutes or less, initial encounter: Secondary | ICD-10-CM | POA: Diagnosis not present

## 2023-03-02 LAB — BASIC METABOLIC PANEL
Anion gap: 12 (ref 5–15)
BUN: 14 mg/dL (ref 6–20)
CO2: 18 mmol/L — ABNORMAL LOW (ref 22–32)
Calcium: 8.8 mg/dL — ABNORMAL LOW (ref 8.9–10.3)
Chloride: 105 mmol/L (ref 98–111)
Creatinine, Ser: 0.69 mg/dL (ref 0.44–1.00)
GFR, Estimated: >60 mL/min (ref >60)
Glucose, Bld: 76 mg/dL (ref 70–99)
Potassium: 3.9 mmol/L (ref 3.5–5.1)
Sodium: 135 mmol/L (ref 135–145)

## 2023-03-02 LAB — CBC
HCT: 43.9 % (ref 36.0–46.0)
Hemoglobin: 14.8 g/dL (ref 12.0–15.0)
MCH: 29.7 pg (ref 26.0–34.0)
MCHC: 33.7 g/dL (ref 30.0–36.0)
MCV: 88 fL (ref 80.0–100.0)
Platelets: 235 10*3/uL (ref 150–400)
RBC: 4.99 MIL/uL (ref 3.87–5.11)
RDW: 14.2 % (ref 11.5–15.5)
WBC: 10.7 10*3/uL — ABNORMAL HIGH (ref 4.0–10.5)
nRBC: 0 % (ref 0.0–0.2)

## 2023-03-02 MED ORDER — PROCHLORPERAZINE MALEATE 5 MG PO TABS
5.0000 mg | ORAL_TABLET | Freq: Once | ORAL | Status: AC
  Administered 2023-03-02: 5 mg via ORAL
  Filled 2023-03-02: qty 1

## 2023-03-02 MED ORDER — KETOROLAC TROMETHAMINE 60 MG/2ML IM SOLN
30.0000 mg | Freq: Once | INTRAMUSCULAR | Status: AC
  Administered 2023-03-02: 30 mg via INTRAMUSCULAR
  Filled 2023-03-02: qty 2

## 2023-03-02 MED ORDER — ACETAMINOPHEN 325 MG PO TABS
650.0000 mg | ORAL_TABLET | Freq: Once | ORAL | Status: AC
  Administered 2023-03-02: 650 mg via ORAL
  Filled 2023-03-02: qty 2

## 2023-03-02 NOTE — ED Notes (Signed)
Pt verbalizes understanding of discharge instructions. Opportunity for questioning and answers were provided. Pt discharged from ED to home with daughter.    
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
## 2023-03-02 NOTE — ED Notes (Signed)
Patient presentation discussed w/ MD, Modesto Charon; see new orders.

## 2024-04-18 ENCOUNTER — Other Ambulatory Visit: Payer: Self-pay | Admitting: Family Medicine

## 2024-04-18 DIAGNOSIS — Z1231 Encounter for screening mammogram for malignant neoplasm of breast: Secondary | ICD-10-CM
